# Patient Record
Sex: Female | Born: 2000 | Race: White | Hispanic: No | Marital: Single | Smoking: Never smoker
Health system: Southern US, Community
[De-identification: ages and names within clinical notes are randomized; demographics above are authoritative.]

## PROBLEM LIST (undated history)

## (undated) DIAGNOSIS — J302 Other seasonal allergic rhinitis: Secondary | ICD-10-CM

## (undated) DIAGNOSIS — O039 Complete or unspecified spontaneous abortion without complication: Secondary | ICD-10-CM

## (undated) DIAGNOSIS — E282 Polycystic ovarian syndrome: Secondary | ICD-10-CM

## (undated) HISTORY — PX: WISDOM TOOTH EXTRACTION: SHX21

## (undated) HISTORY — PX: ADENOIDECTOMY: SUR15

## (undated) HISTORY — DX: Polycystic ovarian syndrome: E28.2

## (undated) HISTORY — PX: TONSILLECTOMY: SUR1361

---
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
## 2019-06-07 ENCOUNTER — Other Ambulatory Visit: Payer: Self-pay

## 2019-06-07 ENCOUNTER — Encounter (HOSPITAL_COMMUNITY): Payer: Self-pay

## 2019-06-07 ENCOUNTER — Ambulatory Visit (HOSPITAL_COMMUNITY)
Admission: EM | Admit: 2019-06-07 | Discharge: 2019-06-07 | Disposition: A | Discharge status: Home / Self Care | Payer: Medicaid Other | Attending: Family Medicine | Admitting: Family Medicine

## 2019-06-07 DIAGNOSIS — Z833 Family history of diabetes mellitus: Secondary | ICD-10-CM | POA: Diagnosis not present

## 2019-06-07 DIAGNOSIS — Z882 Allergy status to sulfonamides status: Secondary | ICD-10-CM | POA: Insufficient documentation

## 2019-06-07 DIAGNOSIS — R195 Other fecal abnormalities: Secondary | ICD-10-CM | POA: Diagnosis not present

## 2019-06-07 DIAGNOSIS — Z20828 Contact with and (suspected) exposure to other viral communicable diseases: Secondary | ICD-10-CM | POA: Diagnosis not present

## 2019-06-07 DIAGNOSIS — R112 Nausea with vomiting, unspecified: Secondary | ICD-10-CM | POA: Diagnosis present

## 2019-06-07 MED ORDER — ONDANSETRON 4 MG PO TBDP
ORAL_TABLET | ORAL | Status: AC
  Filled 2019-06-07: qty 1

## 2019-06-07 MED ORDER — ONDANSETRON 4 MG PO TBDP
4.0000 mg | ORAL_TABLET | Freq: Once | ORAL | Status: AC
  Administered 2019-06-07: 4 mg via ORAL

## 2019-06-07 MED ORDER — ONDANSETRON 4 MG PO TBDP: 4.0000 mg | ORAL_TABLET | Freq: Three times a day (TID) | ORAL | 0 refills | Status: DC | PRN

## 2019-06-07 MED ORDER — OMEPRAZOLE 40 MG PO CPDR: 40.0000 mg | DELAYED_RELEASE_CAPSULE | Freq: Every day | ORAL | 0 refills | Status: DC

## 2019-06-07 NOTE — ED Provider Notes (Signed)
[name]    CSN: [id number] Arrival date & time: 06/07/19  1306      History   Chief Complaint Chief Complaint  Patient presents with  . Hematemesis    HPI Susan Vazquez is a 18 y.o. female.   Susan Vazquez presents with complaints of 1 episode of emesis today at approximately 12:45 today. She had eaten breakfast of pancakes and eggs and shortly after felt nausea and vomited. States there was some dark red/ rusty colored blood appearing substance in emesis, mixed with her undigested food. Ate pork cops last night, no known intake of red food. No abdominal pain but still with nausea. States that for the past few days her bm's have been more loose, has approximately 2 a day, which is not unusual for her. No fevers. Has also had some nasal drainage, headache and occasional body aches. Her throat has been scratchy. No known ill contacts. Works at [employer]. Denies any previous similar. Without contributing medical history.     ROS per HPI, negative if not otherwise mentioned.      Past Medical History:  Diagnosis Date  . Seasonal allergies     There are no active problems to display for this patient.   Past Surgical History:  Procedure Laterality Date  . ADENOIDECTOMY    . TONSILLECTOMY    . TONSILLECTOMY      OB History   No obstetric history on file.      Home Medications    Prior to Admission medications   Medication Sig Start Date End Date Taking? Authorizing Provider  albuterol (PROVENTIL HFA;VENTOLIN HFA) 108 (90 BASE) MCG/ACT inhaler Inhale 2 puffs into the lungs every 4 (four) hours as needed. For wheezing/shortness of breath. 09/04/11   Ann Maki, MD  Ascorbic Acid (VITAMIN C PO) Take 1 tablet by mouth daily.    [provider]  cetirizine (ZYRTEC) 10 MG tablet Take 10 mg by mouth daily.      [provider]  Cholecalciferol (VITAMIN D PO) Take 1 tablet by mouth daily.    [provider]  fluticasone  (FLONASE) 50 MCG/ACT nasal spray Place 2 sprays into the nose daily.      [provider]  ibuprofen (ADVIL,MOTRIN) 200 MG tablet Take 400 mg by mouth every 6 (six) hours as needed for moderate pain.     [provider]  ipratropium (ATROVENT) 0.06 % nasal spray Place 2 sprays into both nostrils 4 (four) times daily. 09/12/13   Gregor Hams, MD  ondansetron (ZOFRAN-ODT) 4 MG disintegrating tablet Take 1 tablet (4 mg total) by mouth every 8 (eight) hours as needed for nausea or vomiting. 06/07/19   Zigmund Gottron, NP    Family History Family History  Problem Relation Age of Onset  . Diabetes Mother   . Healthy Father     Social History Social History   Tobacco Use  . Smoking status: Never Smoker  . Smokeless tobacco: Never Used  Substance Use Topics  . Alcohol use: No  . Drug use: Not on file     Allergies   Sulfa antibiotics   Review of Systems Review of Systems   Physical Exam Triage Vital Signs ED Triage Vitals  Enc Vitals Group     BP 06/07/19 1352 119/77     Pulse Rate 06/07/19 1352 93     Resp 06/07/19 1352 16     Temp 06/07/19 1352 97.9 F (36.6 C)  Temp Source 06/07/19 1352 Oral     SpO2 06/07/19 1352 98 %     Weight --      Height --      Head Circumference --      Peak Flow --      Pain Score 06/07/19 1350 0     Pain Loc --      Pain Edu? --      Excl. in [place]? --    No data found.  Updated Vital Signs BP 119/77 (BP Location: Left Arm)   Pulse 93   Temp 97.9 F (36.6 C) (Oral)   Resp 16   SpO2 98%    Physical Exam Constitutional:      General: She is not in acute distress.    Appearance: She is well-developed.  Cardiovascular:     Rate and Rhythm: Normal rate.  Pulmonary:     Effort: Pulmonary effort is normal.  Abdominal:     Tenderness: There is no abdominal tenderness.  Skin:    General: Skin is warm and dry.  Neurological:     Mental Status: She is alert and oriented to person, place, and time.      UC  Treatments / Results  Labs (all labs ordered are listed, but only abnormal results are displayed) Labs Reviewed  NOVEL CORONAVIRUS, NAA (HOSP ORDER, SEND-OUT TO REF LAB; TAT 18-24 HRS)    EKG   Radiology No results found.  Procedures Procedures (including critical care time)  Medications Ordered in UC Medications  ondansetron (ZOFRAN-ODT) disintegrating tablet 4 mg (has no administration in time range)    Initial Impression / Assessment and Plan / UC Course  I have reviewed the triage vital signs and the nursing notes.  Pertinent labs & imaging results that were available during my care of the patient were reviewed by me and considered in my medical decision making (see chart for details).     1 episode of emesis this morning. Still with mild nausea. No pain. No fevers. No weakness. BP stable, HR is stable. Viral gastroenteritis vs covid vs gastritis considered and discussed. Zofran, omeprazole provided. covid testing collected and pending. Bland diet, fluids encouraged. Return precautions provided. Patient verbalized understanding and agreeable to plan.   Final Clinical Impressions(s) / UC Diagnoses   Final diagnoses:  Intractable vomiting with nausea, unspecified vomiting type  Loose stools     Discharge Instructions     Small frequent sips of fluids- Pedialyte, Gatorade, water, broth- to maintain hydration.   Bland diet as tolerated.  Zofran every 8 hours as needed for nausea or vomiting.   Self isolate until covid results are back and negative.  Will notify you by phone of any positive findings. Your negative results will be sent through your MyChart.     Please return or go to the ER for any worsening of symptoms: abdominal pain, fevers, black or blood in your stool, dizziness, weakness, fatigue, or otherwise worsening.    ED Prescriptions    Medication Sig Dispense Auth. Provider   ondansetron (ZOFRAN-ODT) 4 MG disintegrating tablet Take 1 tablet (4 mg total)  by mouth every 8 (eight) hours as needed for nausea or vomiting. 12 tablet Zigmund Gottron, NP     PDMP not reviewed this encounter.   Zigmund Gottron, NP 06/07/19 [phone]

## 2019-06-07 NOTE — ED Triage Notes (Signed)
Patient presents to Urgent Care with complaints of blood in her vomit since this morning. Patient reports the blood was mixed in with the food she had eaten earlier, has not had additional episodes since, pt denies nausea at present.

## 2019-06-07 NOTE — Discharge Instructions (Signed)
Small frequent sips of fluids- Pedialyte, Gatorade, water, broth- to maintain hydration.   Bland diet as tolerated.  Zofran every 8 hours as needed for nausea or vomiting.   Self isolate until covid results are back and negative.  Will notify you by phone of any positive findings. Your negative results will be sent through your MyChart.     Please return or go to the ER for any worsening of symptoms: abdominal pain, fevers, black or blood in your stool, dizziness, weakness, fatigue, or otherwise worsening.

## 2019-06-09 LAB — NOVEL CORONAVIRUS, NAA (HOSP ORDER, SEND-OUT TO REF LAB; TAT 18-24 HRS): SARS-CoV-2, NAA: NOT DETECTED

## 2019-06-17 ENCOUNTER — Other Ambulatory Visit: Payer: Self-pay

## 2019-06-17 ENCOUNTER — Ambulatory Visit (HOSPITAL_COMMUNITY)
Admission: EM | Admit: 2019-06-17 | Discharge: 2019-06-17 | Disposition: A | Discharge status: Home / Self Care | Payer: Medicaid Other | Attending: Internal Medicine | Admitting: Internal Medicine

## 2019-06-17 ENCOUNTER — Encounter (HOSPITAL_COMMUNITY): Payer: Self-pay

## 2019-06-17 DIAGNOSIS — R5383 Other fatigue: Secondary | ICD-10-CM | POA: Insufficient documentation

## 2019-06-17 DIAGNOSIS — R519 Headache, unspecified: Secondary | ICD-10-CM | POA: Insufficient documentation

## 2019-06-17 DIAGNOSIS — R112 Nausea with vomiting, unspecified: Secondary | ICD-10-CM | POA: Insufficient documentation

## 2019-06-17 DIAGNOSIS — Z79899 Other long term (current) drug therapy: Secondary | ICD-10-CM | POA: Insufficient documentation

## 2019-06-17 DIAGNOSIS — M791 Myalgia, unspecified site: Secondary | ICD-10-CM | POA: Diagnosis not present

## 2019-06-17 DIAGNOSIS — Z3202 Encounter for pregnancy test, result negative: Secondary | ICD-10-CM

## 2019-06-17 DIAGNOSIS — Z20828 Contact with and (suspected) exposure to other viral communicable diseases: Secondary | ICD-10-CM | POA: Insufficient documentation

## 2019-06-17 LAB — POCT URINALYSIS DIP (DEVICE)
Bilirubin Urine: NEGATIVE
Glucose, UA: NEGATIVE mg/dL
Hgb urine dipstick: NEGATIVE
Ketones, ur: NEGATIVE mg/dL
Leukocytes,Ua: NEGATIVE
Nitrite: NEGATIVE
Protein, ur: NEGATIVE mg/dL
Specific Gravity, Urine: 1.02 (ref 1.005–1.030)
Urobilinogen, UA: 0.2 mg/dL (ref 0.0–1.0)
pH: 7 (ref 5.0–8.0)

## 2019-06-17 LAB — POCT PREGNANCY, URINE: Preg Test, Ur: NEGATIVE

## 2019-06-17 MED ORDER — KETOROLAC TROMETHAMINE 60 MG/2ML IM SOLN
INTRAMUSCULAR | Status: AC
  Filled 2019-06-17: qty 2

## 2019-06-17 MED ORDER — ONDANSETRON 4 MG PO TBDP
ORAL_TABLET | ORAL | Status: AC
  Filled 2019-06-17: qty 1

## 2019-06-17 MED ORDER — ONDANSETRON 4 MG PO TBDP
4.0000 mg | ORAL_TABLET | Freq: Once | ORAL | Status: AC
  Administered 2019-06-17: 4 mg via ORAL

## 2019-06-17 MED ORDER — ONDANSETRON 4 MG PO TBDP: 4.0000 mg | ORAL_TABLET | Freq: Three times a day (TID) | ORAL | 0 refills | Status: DC | PRN

## 2019-06-17 MED ORDER — KETOROLAC TROMETHAMINE 60 MG/2ML IM SOLN
60.0000 mg | Freq: Once | INTRAMUSCULAR | Status: AC
  Administered 2019-06-17: 60 mg via INTRAMUSCULAR

## 2019-06-17 NOTE — ED Provider Notes (Signed)
[place]    CSN: [id number] Arrival date & time: 06/17/19  1138      History   Chief Complaint Chief Complaint  Patient presents with  . Generalized Body Aches  . Headache  . Nausea  . Fatigue    HPI Susan Vazquez is a 18 y.o. female.   [name] presents with complaints of headache which started last night. Woke today still with headache and felt unwell. Went to work but felt body aches. Now feels more weak. Nausea and then vomited. No current nausea. No fevers. Still with headache. No medications for headache. Has had some congestion, this is not new. No cough, no sore throat. No known ill contacts. LMP 4 weeks ago. She is sexually active. No urinary symptoms. No diarrhea. No abdominal pain. Taking OTC allergy medications. Episode of emesis 11/6 and was seen here, following that visit she felt well again. No known diagnosed migraine history but states she does tend to get headache's fairly regularly. Without contributing medical history.     ROS per HPI, negative if not otherwise mentioned.      Past Medical History:  Diagnosis Date  . Seasonal allergies     There are no active problems to display for this patient.   Past Surgical History:  Procedure Laterality Date  . ADENOIDECTOMY    . TONSILLECTOMY    . TONSILLECTOMY      OB History   No obstetric history on file.      Home Medications    Prior to Admission medications   Medication Sig Start Date End Date Taking? Authorizing Provider  albuterol (PROVENTIL HFA;VENTOLIN HFA) 108 (90 BASE) MCG/ACT inhaler Inhale 2 puffs into the lungs every 4 (four) hours as needed. For wheezing/shortness of breath. 09/04/11   Ann Maki, MD  Ascorbic Acid (VITAMIN C PO) Take 1 tablet by mouth daily.    [provider]  cetirizine (ZYRTEC) 10 MG tablet Take 10 mg by mouth daily.      [provider]  Cholecalciferol (VITAMIN D PO) Take 1 tablet by mouth daily.    [provider]  fluticasone (FLONASE) 50 MCG/ACT nasal spray Place 2 sprays into the nose daily.      [provider]  ibuprofen (ADVIL,MOTRIN) 200 MG tablet Take 400 mg by mouth every 6 (six) hours as needed for moderate pain.     [provider]  ipratropium (ATROVENT) 0.06 % nasal spray Place 2 sprays into both nostrils 4 (four) times daily. 09/12/13   Gregor Hams, MD  loratadine (CLARITIN) 10 MG tablet Take 10 mg by mouth daily. 04/02/19   [provider]  omeprazole (PRILOSEC) 40 MG capsule Take 1 capsule (40 mg total) by mouth daily. 06/07/19   Zigmund Gottron, NP  ondansetron (ZOFRAN-ODT) 4 MG disintegrating tablet Take 1 tablet (4 mg total) by mouth every 8 (eight) hours as needed for nausea or vomiting. 06/17/19   Zigmund Gottron, NP  SPRINTEC 28 0.25-35 MG-MCG tablet Take 1 tablet by mouth daily. 04/03/19   [provider]    Family History Family History  Problem Relation Age of Onset  . Diabetes Mother   . Healthy Father     Social History Social History   Tobacco Use  . Smoking status: Never Smoker  . Smokeless tobacco: Never Used  Substance Use Topics  . Alcohol use: No  . Drug use: Not on file     Allergies  Sulfa antibiotics   Review of Systems Review of Systems   Physical Exam Triage Vital Signs ED Triage Vitals  Enc Vitals Group     BP 06/17/19 1247 122/71     Pulse Rate 06/17/19 1247 79     Resp 06/17/19 1247 16     Temp 06/17/19 1247 98 F (36.7 C)     Temp Source 06/17/19 1247 Oral     SpO2 06/17/19 1247 100 %     Weight --      Height --      Head Circumference --      Peak Flow --      Pain Score 06/17/19 1243 6     Pain Loc --      Pain Edu? --      Excl. in [place]? --    No data found.  Updated Vital Signs BP 122/71 (BP Location: Left Arm)   Pulse 79   Temp 98 F (36.7 C) (Oral)   Resp 16   LMP  (Within Weeks) Comment: 3-4- weeks per pt  SpO2 100%   Visual Acuity Right Eye Distance:   Left  Eye Distance:   Bilateral Distance:    Right Eye Near:   Left Eye Near:    Bilateral Near:     Physical Exam Constitutional:      General: She is not in acute distress.    Appearance: She is well-developed.  Cardiovascular:     Rate and Rhythm: Normal rate.     Heart sounds: Normal heart sounds.  Pulmonary:     Effort: Pulmonary effort is normal.  Abdominal:     Palpations: Abdomen is soft.     Tenderness: There is no abdominal tenderness.  Skin:    General: Skin is warm and dry.  Neurological:     Mental Status: She is alert and oriented to person, place, and time.      UC Treatments / Results  Labs (all labs ordered are listed, but only abnormal results are displayed) Labs Reviewed  NOVEL CORONAVIRUS, NAA (HOSP ORDER, SEND-OUT TO REF LAB; TAT 18-24 HRS)  POC URINE PREG, ED    EKG   Radiology No results found.  Procedures Procedures (including critical care time)  Medications Ordered in UC Medications  ketorolac (TORADOL) injection 60 mg (60 mg Intramuscular Given 06/17/19 1310)  ondansetron (ZOFRAN-ODT) disintegrating tablet 4 mg (4 mg Oral Given 06/17/19 1310)  ondansetron (ZOFRAN-ODT) 4 MG disintegrating tablet (has no administration in time range)  ketorolac (TORADOL) 60 MG/2ML injection (has no administration in time range)    Initial Impression / Assessment and Plan / UC Course  I have reviewed the triage vital signs and the nursing notes.  Pertinent labs & imaging results that were available during my care of the patient were reviewed by me and considered in my medical decision making (see chart for details).     Non toxic. Benign physical exam. No acute abdominal pain. UA and pregnancy without acute findings. Mild headache still. Question migraine headache as source of nausea/vomiting? Discussed with patient. zofran and toradol provided here today. covid testing collected and pending. Encouraged follow up with a PCP for recheck and management.  Return precautions provided. Patient verbalized understanding and agreeable to plan.   Final Clinical Impressions(s) / UC Diagnoses   Final diagnoses:  Acute nonintractable headache, unspecified headache type  Non-intractable vomiting with nausea, unspecified vomiting type     Discharge Instructions     May take Benadryl as  well to promote sleep and help with headache.  Go home, rest in quiet dark room. Limit screen time.  Drink plenty of water to ensure adequate hydration.   May take tylenol as needed, don't take any additional ibuprofen for another 8 hours.  If symptoms worsen or do not improve in the next week to return to be seen or to follow up with your PCP.      ED Prescriptions    Medication Sig Dispense Auth. Provider   ondansetron (ZOFRAN-ODT) 4 MG disintegrating tablet Take 1 tablet (4 mg total) by mouth every 8 (eight) hours as needed for nausea or vomiting. 12 tablet Zigmund Gottron, NP     PDMP not reviewed this encounter.   Zigmund Gottron, NP 06/17/19 1333

## 2019-06-17 NOTE — ED Triage Notes (Signed)
Pt presents to the UC with headache, generalized body aches, fatigue and nausea x 1 day.

## 2019-06-17 NOTE — Discharge Instructions (Signed)
May take Benadryl as well to promote sleep and help with headache.  Go home, rest in quiet dark room. Limit screen time.  Drink plenty of water to ensure adequate hydration.   May take tylenol as needed, don't take any additional ibuprofen for another 8 hours.  If symptoms worsen or do not improve in the next week to return to be seen or to follow up with your PCP.

## 2019-06-19 LAB — NOVEL CORONAVIRUS, NAA (HOSP ORDER, SEND-OUT TO REF LAB; TAT 18-24 HRS): SARS-CoV-2, NAA: NOT DETECTED

## 2019-07-03 ENCOUNTER — Ambulatory Visit (HOSPITAL_COMMUNITY)
Admission: EM | Admit: 2019-07-03 | Discharge: 2019-07-03 | Disposition: A | Discharge status: Home / Self Care | Payer: Medicaid Other | Attending: Urgent Care | Admitting: Urgent Care

## 2019-07-03 ENCOUNTER — Other Ambulatory Visit: Payer: Self-pay

## 2019-07-03 ENCOUNTER — Encounter (HOSPITAL_COMMUNITY): Payer: Self-pay

## 2019-07-03 ENCOUNTER — Ambulatory Visit (INDEPENDENT_AMBULATORY_CARE_PROVIDER_SITE_OTHER): Payer: Medicaid Other

## 2019-07-03 DIAGNOSIS — M7989 Other specified soft tissue disorders: Secondary | ICD-10-CM | POA: Diagnosis not present

## 2019-07-03 DIAGNOSIS — M79644 Pain in right finger(s): Secondary | ICD-10-CM

## 2019-07-03 DIAGNOSIS — S6701XA Crushing injury of right thumb, initial encounter: Secondary | ICD-10-CM | POA: Diagnosis not present

## 2019-07-03 DIAGNOSIS — W230XXA Caught, crushed, jammed, or pinched between moving objects, initial encounter: Secondary | ICD-10-CM

## 2019-07-03 DIAGNOSIS — S6010XA Contusion of unspecified finger with damage to nail, initial encounter: Secondary | ICD-10-CM

## 2019-07-03 MED ORDER — NAPROXEN 500 MG PO TABS: 500.0000 mg | ORAL_TABLET | Freq: Two times a day (BID) | ORAL | 0 refills | Status: DC

## 2019-07-03 NOTE — ED Triage Notes (Signed)
Pt states she shut her thumb in the car door last night. ( right thumb )

## 2019-07-03 NOTE — ED Provider Notes (Signed)
[place]   MRN: [id number] DOB: 13-Oct-2000  Subjective:   Susan Vazquez is a 18 y.o. female presenting for suffering a right thumb injury last night from accidentally closing the car door on her thumb.  Patient has since had worsening constant throbbing sharp pain of her right thumb with bruising and discoloration of the fingernail.  She has tried Tylenol and Advil with minimal relief.  No current facility-administered medications for this encounter.   Current Outpatient Medications:  .  albuterol (PROVENTIL HFA;VENTOLIN HFA) 108 (90 BASE) MCG/ACT inhaler, Inhale 2 puffs into the lungs every 4 (four) hours as needed. For wheezing/shortness of breath., Disp: , Rfl:  .  Ascorbic Acid (VITAMIN C PO), Take 1 tablet by mouth daily., Disp: , Rfl:  .  cetirizine (ZYRTEC) 10 MG tablet, Take 10 mg by mouth daily.  , Disp: , Rfl:  .  Cholecalciferol (VITAMIN D PO), Take 1 tablet by mouth daily., Disp: , Rfl:  .  fluticasone (FLONASE) 50 MCG/ACT nasal spray, Place 2 sprays into the nose daily.  , Disp: , Rfl:  .  ibuprofen (ADVIL,MOTRIN) 200 MG tablet, Take 400 mg by mouth every 6 (six) hours as needed for moderate pain. , Disp: , Rfl:  .  ipratropium (ATROVENT) 0.06 % nasal spray, Place 2 sprays into both nostrils 4 (four) times daily., Disp: 15 mL, Rfl: 1 .  loratadine (CLARITIN) 10 MG tablet, Take 10 mg by mouth daily., Disp: , Rfl:  .  omeprazole (PRILOSEC) 40 MG capsule, Take 1 capsule (40 mg total) by mouth daily., Disp: 30 capsule, Rfl: 0 .  ondansetron (ZOFRAN-ODT) 4 MG disintegrating tablet, Take 1 tablet (4 mg total) by mouth every 8 (eight) hours as needed for nausea or vomiting., Disp: 12 tablet, Rfl: 0 .  SPRINTEC 28 0.25-35 MG-MCG tablet, Take 1 tablet by mouth daily., Disp: , Rfl:    Allergies  Allergen Reactions  . Sulfa Antibiotics Rash    Past Medical History:  Diagnosis Date  . Seasonal allergies      Past Surgical History:  Procedure Laterality Date  .  ADENOIDECTOMY    . TONSILLECTOMY    . TONSILLECTOMY      Family History  Problem Relation Age of Onset  . Diabetes Mother   . Healthy Father     Social History   Tobacco Use  . Smoking status: Never Smoker  . Smokeless tobacco: Never Used  Substance Use Topics  . Alcohol use: No  . Drug use: Not on file    ROS   Objective:   Vitals: BP 137/64 (BP Location: Right Arm)   Pulse 90   Temp 98.4 F (36.9 C) (Oral)   Resp 18   Wt 215 lb (97.5 kg)   LMP 07/03/2019   SpO2 100%   Physical Exam Constitutional:      General: She is not in acute distress.    Appearance: Normal appearance. She is well-developed. She is obese. She is not ill-appearing, toxic-appearing or diaphoretic.  HENT:     Head: Normocephalic and atraumatic.     Nose: Nose normal.     Mouth/Throat:     Mouth: Mucous membranes are moist.     Pharynx: Oropharynx is clear.  Eyes:     General: No scleral icterus.    Extraocular Movements: Extraocular movements intact.     Pupils: Pupils are equal, round, and reactive to light.  Cardiovascular:     Rate and Rhythm: Normal rate.  Pulmonary:  Effort: Pulmonary effort is normal.  Musculoskeletal:     Right hand: She exhibits decreased range of motion, tenderness, bony tenderness and swelling. She exhibits normal capillary refill, no deformity and no laceration. Normal sensation noted. Normal strength noted.       Hands:  Skin:    General: Skin is warm and dry.  Neurological:     General: No focal deficit present.     Mental Status: She is alert and oriented to person, place, and time.  Psychiatric:        Mood and Affect: Mood normal.        Behavior: Behavior normal.        Thought Content: Thought content normal.        Judgment: Judgment normal.    Dg Finger Thumb Right  Result Date: 07/03/2019 CLINICAL DATA:  Crush injury of right thumb. Pain, bruising, and swelling. EXAM: RIGHT THUMB 2+V COMPARISON:  Radiographs dated 08/24/2011 FINDINGS:  There is no evidence of fracture or dislocation. There is no evidence of arthropathy or other focal bone abnormality. Soft tissues are unremarkable. IMPRESSION: Negative. Electronically Signed   By: Lorriane Shire M.D.   On: 07/03/2019 12:38   A distal thumb splint was placed over distal phalange of right hand.  Assessment and Plan :   1. Crushing injury of right thumb, initial encounter   2. Pain of right thumb   3. Swelling of right thumb   4. Subungual hematoma of finger, initial encounter     We will manage conservatively with NSAID, finger splint and rest. Counseled patient on potential for adverse effects with medications prescribed/recommended today, ER and return-to-clinic precautions discussed, patient verbalized understanding.    Jaynee Eagles, PA-C 07/03/19 1250

## 2019-07-10 ENCOUNTER — Other Ambulatory Visit: Payer: Self-pay

## 2019-07-10 ENCOUNTER — Encounter (HOSPITAL_COMMUNITY): Payer: Self-pay | Admitting: Emergency Medicine

## 2019-07-10 ENCOUNTER — Ambulatory Visit (HOSPITAL_COMMUNITY)
Admission: EM | Admit: 2019-07-10 | Discharge: 2019-07-10 | Disposition: A | Discharge status: Home / Self Care | Payer: Medicaid Other | Attending: Family Medicine | Admitting: Family Medicine

## 2019-07-10 DIAGNOSIS — Z791 Long term (current) use of non-steroidal anti-inflammatories (NSAID): Secondary | ICD-10-CM | POA: Insufficient documentation

## 2019-07-10 DIAGNOSIS — R0602 Shortness of breath: Secondary | ICD-10-CM | POA: Insufficient documentation

## 2019-07-10 DIAGNOSIS — R519 Headache, unspecified: Secondary | ICD-10-CM

## 2019-07-10 DIAGNOSIS — R0982 Postnasal drip: Secondary | ICD-10-CM | POA: Diagnosis not present

## 2019-07-10 DIAGNOSIS — R439 Unspecified disturbances of smell and taste: Secondary | ICD-10-CM | POA: Insufficient documentation

## 2019-07-10 DIAGNOSIS — Z20828 Contact with and (suspected) exposure to other viral communicable diseases: Secondary | ICD-10-CM | POA: Diagnosis not present

## 2019-07-10 DIAGNOSIS — J069 Acute upper respiratory infection, unspecified: Secondary | ICD-10-CM | POA: Diagnosis not present

## 2019-07-10 DIAGNOSIS — R5383 Other fatigue: Secondary | ICD-10-CM | POA: Insufficient documentation

## 2019-07-10 DIAGNOSIS — R438 Other disturbances of smell and taste: Secondary | ICD-10-CM

## 2019-07-10 DIAGNOSIS — Z79899 Other long term (current) drug therapy: Secondary | ICD-10-CM | POA: Insufficient documentation

## 2019-07-10 NOTE — Discharge Instructions (Signed)
Self isolate until covid results are back and negative.  Will notify you by phone of any positive findings. Your negative results will be sent through your MyChart.     Push fluids to ensure adequate hydration and keep secretions thin.  Tylenol and/or ibuprofen as needed for pain or fevers.  Rest.  If symptoms worsen or do not improve in the next week to return to be seen or to follow up with your PCP.

## 2019-07-10 NOTE — ED Triage Notes (Signed)
Pt c/o fatigue onset today but reports 2 days ago she had loss of taste and loss of smell, SOB and headache.   Denies fevers  A&O x4... no acute distress... ambulatory

## 2019-07-10 NOTE — ED Provider Notes (Signed)
[place]    CSN: [id number] Arrival date & time: 07/10/19  1618      History   Chief Complaint Chief Complaint  Patient presents with  . Fatigue    HPI Susan Vazquez is a 18 y.o. female.   Susan Vazquez presents with complaints of congestion which started two days ago. Has been taking allergy medication and aleve-d for congestion. Loss of taste and smell which she noted yesterday. Nausea this morning, has improved today. Headache. Decreased appetite. Fatigue. Shortness of breath. Low energy. No cough. Post nasal drip. No sore throat. Body aches. No fever. No vomiting or diarrhea. Over the counter medications have helped with her congestion. Has been out of work for a few weeks. Her boyfriend had a coworker who had tested positive for covid-19 but he has felt well. History  Of seasonal allergies.    ROS per HPI, negative if not otherwise mentioned.      Past Medical History:  Diagnosis Date  . Seasonal allergies     There are no active problems to display for this patient.   Past Surgical History:  Procedure Laterality Date  . ADENOIDECTOMY    . TONSILLECTOMY    . TONSILLECTOMY      OB History   No obstetric history on file.      Home Medications    Prior to Admission medications   Medication Sig Start Date End Date Taking? Authorizing Provider  albuterol (PROVENTIL HFA;VENTOLIN HFA) 108 (90 BASE) MCG/ACT inhaler Inhale 2 puffs into the lungs every 4 (four) hours as needed. For wheezing/shortness of breath. 09/04/11   Ann Maki, MD  Ascorbic Acid (VITAMIN C PO) Take 1 tablet by mouth daily.    [provider]  cetirizine (ZYRTEC) 10 MG tablet Take 10 mg by mouth daily.      [provider]  Cholecalciferol (VITAMIN D PO) Take 1 tablet by mouth daily.    [provider]  fluticasone (FLONASE) 50 MCG/ACT nasal spray Place 2 sprays into the nose daily.      [provider]  ibuprofen (ADVIL,MOTRIN) 200 MG  tablet Take 400 mg by mouth every 6 (six) hours as needed for moderate pain.     [provider]  ipratropium (ATROVENT) 0.06 % nasal spray Place 2 sprays into both nostrils 4 (four) times daily. 09/12/13   Gregor Hams, MD  loratadine (CLARITIN) 10 MG tablet Take 10 mg by mouth daily. 04/02/19   [provider]  naproxen (NAPROSYN) 500 MG tablet Take 1 tablet (500 mg total) by mouth 2 (two) times daily. 07/03/19   Jaynee Eagles, PA-C  omeprazole (PRILOSEC) 40 MG capsule Take 1 capsule (40 mg total) by mouth daily. 06/07/19   Zigmund Gottron, NP  ondansetron (ZOFRAN-ODT) 4 MG disintegrating tablet Take 1 tablet (4 mg total) by mouth every 8 (eight) hours as needed for nausea or vomiting. 06/17/19   Zigmund Gottron, NP  SPRINTEC 28 0.25-35 MG-MCG tablet Take 1 tablet by mouth daily. 04/03/19   [provider]    Family History Family History  Problem Relation Age of Onset  . Diabetes Mother   . Healthy Father     Social History Social History   Tobacco Use  . Smoking status: Never Smoker  . Smokeless tobacco: Never Used  Substance Use Topics  . Alcohol use: No  . Drug use: Not on file     Allergies   Sulfa antibiotics   Review  of Systems Review of Systems   Physical Exam Triage Vital Signs ED Triage Vitals  Enc Vitals Group     BP 07/10/19 1701 117/78     Pulse --      Resp 07/10/19 1701 18     Temp 07/10/19 1701 99.1 F (37.3 C)     Temp Source 07/10/19 1701 Oral     SpO2 07/10/19 1701 99 %     Weight --      Height --      Head Circumference --      Peak Flow --      Pain Score 07/10/19 1703 0     Pain Loc --      Pain Edu? --      Excl. in [place]? --    No data found.  Updated Vital Signs BP 117/78 (BP Location: Left Arm)   Temp 99.1 F (37.3 C) (Oral)   Resp 18   LMP 07/03/2019   SpO2 99%    Physical Exam Constitutional:      General: She is not in acute distress.    Appearance: She is well-developed.  HENT:     Nose: Nose  normal.     Mouth/Throat:     Mouth: Mucous membranes are moist.     Pharynx: No oropharyngeal exudate or posterior oropharyngeal erythema.  Eyes:     Pupils: Pupils are equal, round, and reactive to light.  Cardiovascular:     Rate and Rhythm: Normal rate.  Pulmonary:     Effort: Pulmonary effort is normal.  Skin:    General: Skin is warm and dry.  Neurological:     Mental Status: She is alert and oriented to person, place, and time.      UC Treatments / Results  Labs (all labs ordered are listed, but only abnormal results are displayed) Labs Reviewed  NOVEL CORONAVIRUS, NAA (HOSP ORDER, SEND-OUT TO REF LAB; TAT 18-24 HRS)    EKG   Radiology No results found.  Procedures Procedures (including critical care time)  Medications Ordered in UC Medications - No data to display  Initial Impression / Assessment and Plan / UC Course  I have reviewed the triage vital signs and the nursing notes.  Pertinent labs & imaging results that were available during my care of the patient were reviewed by me and considered in my medical decision making (see chart for details).     Non toxic. Benign physical exam. Afebrile. No increased work of breathing, no hypoxia. covid testing collected and pending. Will notify of any positive findings and if any changes to treatment are needed.  Isolation recommended. Return precautions provided. Patient verbalized understanding and agreeable to plan.   Final Clinical Impressions(s) / UC Diagnoses   Final diagnoses:  Upper respiratory tract infection, unspecified type     Discharge Instructions     Self isolate until covid results are back and negative.  Will notify you by phone of any positive findings. Your negative results will be sent through your MyChart.     Push fluids to ensure adequate hydration and keep secretions thin.  Tylenol and/or ibuprofen as needed for pain or fevers.  Rest.  If symptoms worsen or do not improve in the  next week to return to be seen or to follow up with your PCP.      ED Prescriptions    None     PDMP not reviewed this encounter.   Zigmund Gottron, NP 07/10/19 1753

## 2019-07-12 LAB — NOVEL CORONAVIRUS, NAA (HOSP ORDER, SEND-OUT TO REF LAB; TAT 18-24 HRS): SARS-CoV-2, NAA: NOT DETECTED

## 2019-10-30 ENCOUNTER — Ambulatory Visit (HOSPITAL_COMMUNITY)
Admission: EM | Admit: 2019-10-30 | Discharge: 2019-10-30 | Discharge status: Against Medical Advice | Payer: Medicaid Other

## 2019-10-30 ENCOUNTER — Other Ambulatory Visit: Payer: Self-pay

## 2019-10-30 NOTE — ED Notes (Signed)
Pt did not answered when I called her in the waiting room.

## 2019-11-04 ENCOUNTER — Other Ambulatory Visit: Payer: Self-pay | Admitting: Pediatrics

## 2019-11-04 DIAGNOSIS — R1011 Right upper quadrant pain: Secondary | ICD-10-CM

## 2019-11-06 ENCOUNTER — Ambulatory Visit (HOSPITAL_COMMUNITY)
Admission: RE | Admit: 2019-11-06 | Discharge: 2019-11-06 | Disposition: A | Discharge status: Home / Self Care | Payer: Medicaid Other | Source: Ambulatory Visit | Attending: Pediatrics | Admitting: Pediatrics

## 2019-11-06 ENCOUNTER — Ambulatory Visit
Admission: RE | Admit: 2019-11-06 | Discharge: 2019-11-06 | Disposition: A | Discharge status: Home / Self Care | Payer: Medicaid Other | Source: Ambulatory Visit | Attending: Pediatrics | Admitting: Pediatrics

## 2019-11-06 ENCOUNTER — Other Ambulatory Visit: Payer: Self-pay

## 2019-11-06 ENCOUNTER — Other Ambulatory Visit: Payer: Self-pay | Admitting: Pediatrics

## 2019-11-06 DIAGNOSIS — R1011 Right upper quadrant pain: Secondary | ICD-10-CM

## 2019-11-06 DIAGNOSIS — R1084 Generalized abdominal pain: Secondary | ICD-10-CM

## 2020-01-20 ENCOUNTER — Other Ambulatory Visit: Payer: Self-pay

## 2020-01-20 ENCOUNTER — Ambulatory Visit (HOSPITAL_COMMUNITY)
Admission: EM | Admit: 2020-01-20 | Discharge: 2020-01-20 | Disposition: A | Discharge status: Home / Self Care | Payer: Medicaid Other | Attending: Family Medicine | Admitting: Family Medicine

## 2020-01-20 ENCOUNTER — Encounter (HOSPITAL_COMMUNITY): Payer: Self-pay

## 2020-01-20 DIAGNOSIS — R519 Headache, unspecified: Secondary | ICD-10-CM

## 2020-01-20 DIAGNOSIS — R509 Fever, unspecified: Secondary | ICD-10-CM | POA: Diagnosis not present

## 2020-01-20 DIAGNOSIS — J029 Acute pharyngitis, unspecified: Secondary | ICD-10-CM | POA: Diagnosis not present

## 2020-01-20 DIAGNOSIS — R52 Pain, unspecified: Secondary | ICD-10-CM

## 2020-01-20 DIAGNOSIS — J020 Streptococcal pharyngitis: Secondary | ICD-10-CM

## 2020-01-20 DIAGNOSIS — R6883 Chills (without fever): Secondary | ICD-10-CM

## 2020-01-20 DIAGNOSIS — R000 Tachycardia, unspecified: Secondary | ICD-10-CM

## 2020-01-20 LAB — POCT RAPID STREP A: Streptococcus, Group A Screen (Direct): POSITIVE — AB

## 2020-01-20 MED ORDER — ACETAMINOPHEN 325 MG PO TABS
ORAL_TABLET | ORAL | Status: AC
  Filled 2020-01-20: qty 2

## 2020-01-20 MED ORDER — ACETAMINOPHEN 325 MG PO TABS
650.0000 mg | ORAL_TABLET | Freq: Once | ORAL | Status: AC
  Administered 2020-01-20: 650 mg via ORAL

## 2020-01-20 MED ORDER — AMOXICILLIN 875 MG PO TABS: 875.0000 mg | ORAL_TABLET | Freq: Two times a day (BID) | ORAL | 0 refills | Status: AC

## 2020-01-20 NOTE — ED Provider Notes (Addendum)
[place]   [id number] 01/20/20 Arrival Time: 1024  OY:DXAJ THROAT  SUBJECTIVE: History from: patient.  Susan Vazquez is a 19 y.o. female who presents with abrupt onset of sore throat, fever, headache, body aches, chills for 3 days. Denies to sick exposure to Covid, strep, flu or mono, or precipitating event. Has tried tylenol with little relief. Symptoms are made worse with swallowing, but tolerating liquids and own secretions without difficulty. Reports previous symptoms in the past.     Denies ear pain, sinus pain, rhinorrhea, nasal congestion, cough, SOB, wheezing, chest pain, nausea, rash, changes in bowel or bladder habits.     ROS: As per HPI.  All other pertinent ROS negative.     Past Medical History:  Diagnosis Date   Seasonal allergies    Past Surgical History:  Procedure Laterality Date   ADENOIDECTOMY     TONSILLECTOMY     TONSILLECTOMY     Allergies  Allergen Reactions   Sulfa Antibiotics Rash   No current facility-administered medications on file prior to encounter.   Current Outpatient Medications on File Prior to Encounter  Medication Sig Dispense Refill   albuterol (PROVENTIL HFA;VENTOLIN HFA) 108 (90 BASE) MCG/ACT inhaler Inhale 2 puffs into the lungs every 4 (four) hours as needed. For wheezing/shortness of breath.     Ascorbic Acid (VITAMIN C PO) Take 1 tablet by mouth daily.     cetirizine (ZYRTEC) 10 MG tablet Take 10 mg by mouth daily.       Cholecalciferol (VITAMIN D PO) Take 1 tablet by mouth daily.     fluticasone (FLONASE) 50 MCG/ACT nasal spray Place 2 sprays into the nose daily.       ibuprofen (ADVIL,MOTRIN) 200 MG tablet Take 400 mg by mouth every 6 (six) hours as needed for moderate pain.      ipratropium (ATROVENT) 0.06 % nasal spray Place 2 sprays into both nostrils 4 (four) times daily. 15 mL 1   loratadine (CLARITIN) 10 MG tablet Take 10 mg by mouth daily.     naproxen (NAPROSYN) 500 MG tablet Take 1 tablet  (500 mg total) by mouth 2 (two) times daily. 30 tablet 0   omeprazole (PRILOSEC) 40 MG capsule Take 1 capsule (40 mg total) by mouth daily. 30 capsule 0   ondansetron (ZOFRAN-ODT) 4 MG disintegrating tablet Take 1 tablet (4 mg total) by mouth every 8 (eight) hours as needed for nausea or vomiting. 12 tablet 0   SPRINTEC 28 0.25-35 MG-MCG tablet Take 1 tablet by mouth daily.     Social History   Socioeconomic History   Marital status: Single    Spouse name: Not on file   Number of children: Not on file   Years of education: Not on file   Highest education level: Not on file  Occupational History   Not on file  Tobacco Use   Smoking status: Never Smoker   Smokeless tobacco: Never Used  Vaping Use   Vaping Use: Never used  Substance and Sexual Activity   Alcohol use: No   Drug use: Not on file   Sexual activity: Not on file  Other Topics Concern   Not on file  Social History Narrative   Not on file   Social Determinants of Health   Financial Resource Strain:    Difficulty of Paying Living Expenses:   Food Insecurity:    Worried About [place] in the Last Year:    Arboriculturist in  the Last Year:   Transportation Needs:    Film/video editor (Medical):    Lack of Transportation (Non-Medical):   Physical Activity:    Days of Exercise per Week:    Minutes of Exercise per Session:   Stress:    Feeling of Stress :   Social Connections:    Frequency of Communication with Friends and Family:    Frequency of Social Gatherings with Friends and Family:    Attends Religious Services:    Active Member of Clubs or Organizations:    Attends Music therapist:    Marital Status:   Intimate Partner Violence:    Fear of Current or Ex-Partner:    Emotionally Abused:    Physically Abused:    Sexually Abused:    Family History  Problem Relation Age of Onset   Diabetes Mother    Healthy Father      OBJECTIVE:  Vitals:   01/20/20 1155  BP: 114/79  Pulse: (!) 148  Resp: 16  Temp: (!) 102.3 F (39.1 C)  TempSrc: Oral  SpO2: 99%     General appearance: alert; appears fatigued, but nontoxic, speaking in full sentences and managing own secretions HEENT: NCAT; Ears: EACs clear, TMs pearly gray with visible cone of light, without erythema; Eyes: PERRL, EOMI grossly; Nose: no obvious rhinorrhea; Throat: tonsils absent, oropharynx erythematous without white tonsillar exudates, uvula midline Neck: supple with LAD Lungs: CTA bilaterally without adventitious breath sounds; cough absent Heart: regular rate and rhythm.  Radial pulses 2+ symmetrical bilaterally Skin: warm and dry Psychological: alert and cooperative; normal mood and affect  LABS: Results for orders placed or performed during the hospital encounter of 01/20/20 (from the past 24 hour(s))  POCT rapid strep A [facility])     Status: Abnormal   Collection Time: 01/20/20 12:14 PM  Result Value Ref Range   Streptococcus, Group A Screen (Direct) POSITIVE (A) NEGATIVE     ASSESSMENT & PLAN:  1. Strep throat   2. Fever, unspecified fever cause   3. Tachycardia   4. Nonintractable headache, unspecified chronicity pattern, unspecified headache type   5. Chills   6. Body aches     Meds ordered this encounter  Medications   acetaminophen (TYLENOL) tablet 650 mg   amoxicillin (AMOXIL) 875 MG tablet    Sig: Take 1 tablet (875 mg total) by mouth 2 (two) times daily for 10 days.    Dispense:  20 tablet    Refill:  0    Order Specific Question:   Supervising Provider    Answer:   Chase Picket [id number]    Strep Pharyngitis  Strep was positive.  Push fluids and get rest Prescribed amoxicillin twice daily for 10 days.   Take as directed and to completion.  Drink warm or cool liquids, use throat lozenges, or popsicles to help alleviate symptoms Take OTC ibuprofen or tylenol as needed for pain Follow up  with PCP if symptoms persist Return or go to ER if you have any new or worsening symptoms such as fever, chills, nausea, vomiting, worsening sore throat, cough, abdominal pain, chest pain, changes in bowel or bladder habits, etc...  Follow up with PCP if symptoms persists Return or go to ER if patient has any new or worsening symptoms such as fever, chills, nausea, vomiting, worsening sore throat, cough, abdominal pain, chest pain, changes in bowel or bladder habits  Reviewed expectations re: course of current medical issues. Questions answered. Outlined signs and  symptoms indicating need for more acute intervention. Patient verbalized understanding. After Visit Summary given.          Faustino Congress, NP 01/20/20 [place]    Faustino Congress, NP 01/20/20 1232

## 2020-01-20 NOTE — ED Triage Notes (Signed)
Patient reports fever (up to 103), chills, body aches and pains, earache, headache, and swollen glands since Saturday.

## 2020-01-20 NOTE — Discharge Instructions (Signed)
You have strep throat  I have sent in amoxicillin to your pharmacy to take twice a day for 10 days  Follow up if you are not feeling better by Wednesday, follow up with this office

## 2020-01-30 DIAGNOSIS — Z419 Encounter for procedure for purposes other than remedying health state, unspecified: Secondary | ICD-10-CM | POA: Diagnosis not present

## 2020-02-26 DIAGNOSIS — N939 Abnormal uterine and vaginal bleeding, unspecified: Secondary | ICD-10-CM | POA: Diagnosis not present

## 2020-03-01 DIAGNOSIS — Z419 Encounter for procedure for purposes other than remedying health state, unspecified: Secondary | ICD-10-CM | POA: Diagnosis not present

## 2020-03-04 ENCOUNTER — Other Ambulatory Visit: Payer: Self-pay

## 2020-03-04 ENCOUNTER — Ambulatory Visit
Admission: EM | Admit: 2020-03-04 | Discharge: 2020-03-04 | Disposition: A | Discharge status: Home / Self Care | Payer: Medicaid Other | Attending: Emergency Medicine | Admitting: Emergency Medicine

## 2020-03-04 DIAGNOSIS — J069 Acute upper respiratory infection, unspecified: Secondary | ICD-10-CM | POA: Diagnosis not present

## 2020-03-04 NOTE — ED Triage Notes (Signed)
Pt c/o fatigue, nasal congestion with post nasal drip, and sore throat since Monday evening. States needs work note for yesterday and today.

## 2020-03-04 NOTE — ED Provider Notes (Signed)
[name]    CSN: [id number] Arrival date & time: 03/04/20  0849      History   Chief Complaint Chief Complaint  Patient presents with  . Nasal Congestion    HPI Susan Vazquez is a 19 y.o. female with history of seasonal allergies presenting for URI symptoms since Monday.  Patient provides history: Endorsing fatigue, nasal congestion, postnasal drip, scratchy throat.  Very mild cough without hemoptysis or increased sputum production.  Denies difficulty breathing, chest pain, known sick contacts.  Requesting covid testing to return to work.    Past Medical History:  Diagnosis Date  . Seasonal allergies     There are no problems to display for this patient.   Past Surgical History:  Procedure Laterality Date  . ADENOIDECTOMY    . TONSILLECTOMY    . TONSILLECTOMY      OB History   No obstetric history on file.      Home Medications    Prior to Admission medications   Medication Sig Start Date End Date Taking? Authorizing Provider  albuterol (PROVENTIL HFA;VENTOLIN HFA) 108 (90 BASE) MCG/ACT inhaler Inhale 2 puffs into the lungs every 4 (four) hours as needed. For wheezing/shortness of breath. 09/04/11   Ann Maki, MD  Ascorbic Acid (VITAMIN C PO) Take 1 tablet by mouth daily.    [provider]  Cholecalciferol (VITAMIN D PO) Take 1 tablet by mouth daily.    [provider]  fluticasone (FLONASE) 50 MCG/ACT nasal spray Place 2 sprays into the nose daily.      [provider]  ibuprofen (ADVIL,MOTRIN) 200 MG tablet Take 400 mg by mouth every 6 (six) hours as needed for moderate pain.     [provider]  ipratropium (ATROVENT) 0.06 % nasal spray Place 2 sprays into both nostrils 4 (four) times daily. 09/12/13   Gregor Hams, MD  loratadine (CLARITIN) 10 MG tablet Take 10 mg by mouth daily. 04/02/19   [provider]  naproxen (NAPROSYN) 500 MG tablet Take 1 tablet (500 mg total) by mouth 2 (two) times  daily. 07/03/19   Jaynee Eagles, PA-C  cetirizine (ZYRTEC) 10 MG tablet Take 10 mg by mouth daily.    03/04/20  [provider]    Family History Family History  Problem Relation Age of Onset  . Diabetes Mother   . Healthy Father     Social History Social History   Tobacco Use  . Smoking status: Never Smoker  . Smokeless tobacco: Never Used  Vaping Use  . Vaping Use: Never used  Substance Use Topics  . Alcohol use: No  . Drug use: Not on file     Allergies   Sulfa antibiotics   Review of Systems As per HPI   Physical Exam Triage Vital Signs ED Triage Vitals  Enc Vitals Group     BP      Pulse      Resp      Temp      Temp src      SpO2      Weight      Height      Head Circumference      Peak Flow      Pain Score      Pain Loc      Pain Edu?      Excl. in [place]?    No data found.  Updated Vital Signs BP 117/85 (BP Location: Left Arm)   Pulse  93   Temp 98 F (36.7 C) (Oral)   Resp 18   LMP 03/04/2020   SpO2 98%   Visual Acuity Right Eye Distance:   Left Eye Distance:   Bilateral Distance:    Right Eye Near:   Left Eye Near:    Bilateral Near:     Physical Exam Constitutional:      General: She is not in acute distress.    Appearance: She is obese. She is not ill-appearing or diaphoretic.  HENT:     Head: Normocephalic and atraumatic.     Nose:     Comments: Bilateral turbinate edema with mucosal pallor    Mouth/Throat:     Mouth: Mucous membranes are moist.     Pharynx: Oropharynx is clear. No oropharyngeal exudate or posterior oropharyngeal erythema.  Eyes:     General: No scleral icterus.    Conjunctiva/sclera: Conjunctivae normal.     Pupils: Pupils are equal, round, and reactive to light.  Neck:     Comments: Trachea midline, negative JVD Cardiovascular:     Rate and Rhythm: Normal rate and regular rhythm.     Heart sounds: No murmur heard.  No gallop.   Pulmonary:     Effort: Pulmonary effort is normal. No respiratory  distress.     Breath sounds: No wheezing, rhonchi or rales.  Musculoskeletal:     Cervical back: Neck supple. No tenderness.  Lymphadenopathy:     Cervical: No cervical adenopathy.  Skin:    Capillary Refill: Capillary refill takes less than 2 seconds.     Coloration: Skin is not jaundiced or pale.     Findings: No rash.  Neurological:     General: No focal deficit present.     Mental Status: She is alert and oriented to person, place, and time.      UC Treatments / Results  Labs (all labs ordered are listed, but only abnormal results are displayed) Labs Reviewed  NOVEL CORONAVIRUS, NAA    EKG   Radiology No results found.  Procedures Procedures (including critical care time)  Medications Ordered in UC Medications - No data to display  Initial Impression / Assessment and Plan / UC Course  I have reviewed the triage vital signs and the nursing notes.  Pertinent labs & imaging results that were available during my care of the patient were reviewed by me and considered in my medical decision making (see chart for details).     Patient afebrile, nontoxic, with SpO2 98%.  Covid PCR pending.  Patient to quarantine until results are back.  We will treat supportively as outlined below.  Return precautions discussed, patient verbalized understanding and is agreeable to plan. Final Clinical Impressions(s) / UC Diagnoses   Final diagnoses:  URI, acute     Discharge Instructions     Start flonase, atrovent nasal spray for nasal congestion/drainage. You can use over the counter nasal saline rinse such as neti pot for nasal congestion. Keep hydrated, your urine should be clear to pale yellow in color. Tylenol/motrin for fever and pain. Monitor for any worsening of symptoms, chest pain, shortness of breath, wheezing, swelling of the throat, go to the emergency department for further evaluation needed.   Recommend OTC decongestant as well!    ED Prescriptions    None      PDMP not reviewed this encounter.   [place], [place], [place] 03/04/20 [phone]

## 2020-03-04 NOTE — Discharge Instructions (Addendum)
Start flonase, atrovent nasal spray for nasal congestion/drainage. You can use over the counter nasal saline rinse such as neti pot for nasal congestion. Keep hydrated, your urine should be clear to pale yellow in color. Tylenol/motrin for fever and pain. Monitor for any worsening of symptoms, chest pain, shortness of breath, wheezing, swelling of the throat, go to the emergency department for further evaluation needed.   Recommend OTC decongestant as well!

## 2020-03-06 LAB — SARS-COV-2, NAA 2 DAY TAT

## 2020-03-06 LAB — NOVEL CORONAVIRUS, NAA: SARS-CoV-2, NAA: NOT DETECTED

## 2020-04-01 DIAGNOSIS — Z419 Encounter for procedure for purposes other than remedying health state, unspecified: Secondary | ICD-10-CM | POA: Diagnosis not present

## 2020-05-01 DIAGNOSIS — Z419 Encounter for procedure for purposes other than remedying health state, unspecified: Secondary | ICD-10-CM | POA: Diagnosis not present

## 2020-06-01 DIAGNOSIS — Z419 Encounter for procedure for purposes other than remedying health state, unspecified: Secondary | ICD-10-CM | POA: Diagnosis not present

## 2020-06-11 ENCOUNTER — Ambulatory Visit
Admission: EM | Admit: 2020-06-11 | Discharge: 2020-06-11 | Disposition: A | Discharge status: Home / Self Care | Payer: Medicaid Other | Attending: Emergency Medicine | Admitting: Emergency Medicine

## 2020-06-11 ENCOUNTER — Other Ambulatory Visit: Payer: Self-pay

## 2020-06-11 ENCOUNTER — Encounter: Payer: Self-pay | Admitting: Emergency Medicine

## 2020-06-11 DIAGNOSIS — Z20822 Contact with and (suspected) exposure to covid-19: Secondary | ICD-10-CM | POA: Diagnosis not present

## 2020-06-11 DIAGNOSIS — R6889 Other general symptoms and signs: Secondary | ICD-10-CM

## 2020-06-11 MED ORDER — OSELTAMIVIR PHOSPHATE 45 MG PO CAPS: 45.0000 mg | ORAL_CAPSULE | Freq: Two times a day (BID) | ORAL | 0 refills | Status: DC

## 2020-06-11 NOTE — ED Triage Notes (Signed)
Pt said she woke up this am with a sore throat and then the symptoms progressed over the say with fatigue, fevers, chills, generalized body aches.Pt said she has wanted to sleep all day.

## 2020-06-11 NOTE — Discharge Instructions (Signed)
Your COVID test is pending - it is important to quarantine / isolate at home until your results are back. °If you test positive and would like further evaluation for persistent or worsening symptoms, you may schedule an E-visit or virtual (video) visit throughout the [facility] MyChart app or website. ° °PLEASE NOTE: If you develop severe chest pain or shortness of breath please go to the ER or call [phone] for further evaluation --> DO NOT schedule electronic or virtual visits for this. °Please call our office for further guidance / recommendations as needed. ° °For information about the Covid vaccine, please visit [email or web address] °

## 2020-06-11 NOTE — ED Provider Notes (Signed)
[name]    CSN: [id number] Arrival date & time: 06/11/20  1647      History   Chief Complaint Chief Complaint  Patient presents with  . Generalized Body Aches    HPI Susan Vazquez is a 19 y.o. female  Resenting for flulike symptoms.  Endorsing sore throat, fatigue, myalgias, fever and chills since this morning.  States that she has been sleeping most of the day.  Denies cough, difficulty breathing or chest pain.  Reports good oral intake without emesis or diarrhea.  Past Medical History:  Diagnosis Date  . Seasonal allergies     There are no problems to display for this patient.   Past Surgical History:  Procedure Laterality Date  . ADENOIDECTOMY    . TONSILLECTOMY    . TONSILLECTOMY      OB History   No obstetric history on file.      Home Medications    Prior to Admission medications   Medication Sig Start Date End Date Taking? Authorizing Provider  albuterol (PROVENTIL HFA;VENTOLIN HFA) 108 (90 BASE) MCG/ACT inhaler Inhale 2 puffs into the lungs every 4 (four) hours as needed. For wheezing/shortness of breath. 09/04/11   Ann Maki, MD  Ascorbic Acid (VITAMIN C PO) Take 1 tablet by mouth daily.    [provider]  Cholecalciferol (VITAMIN D PO) Take 1 tablet by mouth daily.    [provider]  fluticasone (FLONASE) 50 MCG/ACT nasal spray Place 2 sprays into the nose daily.      [provider]  ibuprofen (ADVIL,MOTRIN) 200 MG tablet Take 400 mg by mouth every 6 (six) hours as needed for moderate pain.     [provider]  ipratropium (ATROVENT) 0.06 % nasal spray Place 2 sprays into both nostrils 4 (four) times daily. 09/12/13   Gregor Hams, MD  loratadine (CLARITIN) 10 MG tablet Take 10 mg by mouth daily. 04/02/19   [provider]  naproxen (NAPROSYN) 500 MG tablet Take 1 tablet (500 mg total) by mouth 2 (two) times daily. 07/03/19   Jaynee Eagles, PA-C  oseltamivir (TAMIFLU) 45 MG capsule Take 1  capsule (45 mg total) by mouth 2 (two) times daily for 5 days. 06/11/20 06/16/20  Hall-Potvin, [place], PA-C  cetirizine (ZYRTEC) 10 MG tablet Take 10 mg by mouth daily.    03/04/20  [provider]    Family History Family History  Problem Relation Age of Onset  . Diabetes Mother   . Healthy Father     Social History Social History   Tobacco Use  . Smoking status: Never Smoker  . Smokeless tobacco: Never Used  Vaping Use  . Vaping Use: Never used  Substance Use Topics  . Alcohol use: No  . Drug use: Not on file     Allergies   Sulfa antibiotics   Review of Systems Review of Systems  Constitutional: Positive for activity change, appetite change, chills, fatigue and fever.  HENT: Negative for ear pain, sinus pain, sore throat and voice change.   Eyes: Negative for pain, redness and visual disturbance.  Respiratory: Negative for cough and shortness of breath.   Cardiovascular: Negative for chest pain and palpitations.  Gastrointestinal: Negative for abdominal pain, diarrhea and vomiting.  Musculoskeletal: Positive for myalgias. Negative for arthralgias, neck pain and neck stiffness.  Skin: Negative for rash and wound.  Neurological: Negative for syncope and headaches.     Physical Exam Triage Vital Signs ED Triage Vitals [06/11/20 1716]  Enc Vitals Group     BP 113/82     Pulse Rate (!) 147     Resp 18     Temp 99.5 F (37.5 C)     Temp Source Oral     SpO2 98 %     Weight      Height 5\' 7"  (1.702 m)     Head Circumference      Peak Flow      Pain Score 3     Pain Loc      Pain Edu?      Excl. in [place]?    No data found.  Updated Vital Signs BP 113/82 (BP Location: Right Arm)   Pulse (!) 147   Temp 99.5 F (37.5 C) (Oral)   Resp 18   Ht 5\' 7"  (1.702 m)   LMP 06/02/2020   SpO2 98%   BMI 33.67 kg/m   Visual Acuity Right Eye Distance:   Left Eye Distance:   Bilateral Distance:    Right Eye Near:   Left Eye Near:    Bilateral Near:       Physical Exam Constitutional:      General: She is not in acute distress.    Appearance: She is not ill-appearing or diaphoretic.  HENT:     Head: Normocephalic and atraumatic.     Mouth/Throat:     Mouth: Mucous membranes are moist.     Pharynx: Oropharynx is clear. No oropharyngeal exudate or posterior oropharyngeal erythema.  Eyes:     General: No scleral icterus.    Conjunctiva/sclera: Conjunctivae normal.     Pupils: Pupils are equal, round, and reactive to light.  Neck:     Comments: Trachea midline, negative JVD Cardiovascular:     Rate and Rhythm: Regular rhythm. Tachycardia present.     Heart sounds: No murmur heard.  No gallop.   Pulmonary:     Effort: Pulmonary effort is normal. No respiratory distress.     Breath sounds: No wheezing, rhonchi or rales.  Musculoskeletal:     Cervical back: Neck supple. No tenderness.  Lymphadenopathy:     Cervical: No cervical adenopathy.  Skin:    Capillary Refill: Capillary refill takes less than 2 seconds.     Coloration: Skin is not jaundiced or pale.     Findings: No rash.  Neurological:     General: No focal deficit present.     Mental Status: She is alert and oriented to person, place, and time.      UC Treatments / Results  Labs (all labs ordered are listed, but only abnormal results are displayed) Labs Reviewed  COVID-19, FLU A+B AND RSV    EKG   Radiology No results found.  Procedures Procedures (including critical care time)  Medications Ordered in UC Medications - No data to display  Initial Impression / Assessment and Plan / UC Course  I have reviewed the triage vital signs and the nursing notes.  Pertinent labs & imaging results that were available during my care of the patient were reviewed by me and considered in my medical decision making (see chart for details).     Patient afebrile, nontoxic in office today.  Appears ill, though without acute distress.  Patient is tachycardic.  EKG done  office, reviewed by me without previous to compare: Sinus tachycardia with ventricular rate 150 bpm.  No QTC prolongation, ST elevation or depression.  Nonacute.  Will push fluids, treat empirically for flu while flu A/B,  Covid PCR are pending.  Return precautions discussed, pt verbalized understanding and is agreeable to plan. Final Clinical Impressions(s) / UC Diagnoses   Final diagnoses:  Encounter for screening laboratory testing for COVID-19 virus  Flu-like symptoms     Discharge Instructions     Your COVID test is pending - it is important to quarantine / isolate at home until your results are back. If you test positive and would like further evaluation for persistent or worsening symptoms, you may schedule an E-visit or virtual (video) visit throughout the [facility] app or website.  PLEASE NOTE: If you develop severe chest pain or shortness of breath please go to the ER or call [phone] for further evaluation --> DO NOT schedule electronic or virtual visits for this. Please call our office for further guidance / recommendations as needed.  For information about the Covid vaccine, please visit [email or web address]    ED Prescriptions    Medication Sig Dispense Auth. Provider   oseltamivir (TAMIFLU) 45 MG capsule Take 1 capsule (45 mg total) by mouth 2 (two) times daily for 5 days. 10 capsule Hall-Potvin, [place], PA-C     PDMP not reviewed this encounter.   Hall-Potvin, [place], [place] 06/11/20 1747

## 2020-06-12 LAB — COVID-19, FLU A+B AND RSV
Influenza A, NAA: NOT DETECTED
Influenza B, NAA: NOT DETECTED
RSV, NAA: NOT DETECTED
SARS-CoV-2, NAA: NOT DETECTED

## 2020-06-16 ENCOUNTER — Ambulatory Visit
Admission: EM | Admit: 2020-06-16 | Discharge: 2020-06-16 | Disposition: A | Discharge status: Home / Self Care | Payer: Medicaid Other | Attending: Internal Medicine | Admitting: Internal Medicine

## 2020-06-16 DIAGNOSIS — H66001 Acute suppurative otitis media without spontaneous rupture of ear drum, right ear: Secondary | ICD-10-CM

## 2020-06-16 MED ORDER — AMOXICILLIN 875 MG PO TABS: 875.0000 mg | ORAL_TABLET | Freq: Two times a day (BID) | ORAL | 0 refills | Status: DC

## 2020-06-16 NOTE — ED Triage Notes (Signed)
Pt c/o throbbing rt ear pain since last night. States rt ear feels clogged and stuffy. States seen here last week for sinus infection.

## 2020-06-16 NOTE — ED Provider Notes (Signed)
[name]    CSN: [id number] Arrival date & time: 06/16/20  0944      History   Chief Complaint Chief Complaint  Patient presents with   Otalgia    HPI Susan Vazquez is a 19 y.o. female who presents with R ear pain since last night. Her R ear also feels clogged. She had a sinus infection last week and her nose congestion is better. Has been using Flonase til 2 days ago. Denies a fever since onset of ear pain.     Past Medical History:  Diagnosis Date   Seasonal allergies     There are no problems to display for this patient.   Past Surgical History:  Procedure Laterality Date   ADENOIDECTOMY     TONSILLECTOMY     TONSILLECTOMY      OB History   No obstetric history on file.      Home Medications    Prior to Admission medications   Medication Sig Start Date End Date Taking? Authorizing Provider  albuterol (PROVENTIL HFA;VENTOLIN HFA) 108 (90 BASE) MCG/ACT inhaler Inhale 2 puffs into the lungs every 4 (four) hours as needed. For wheezing/shortness of breath. 09/04/11   Ann Maki, MD  Ascorbic Acid (VITAMIN C PO) Take 1 tablet by mouth daily.    [provider]  Cholecalciferol (VITAMIN D PO) Take 1 tablet by mouth daily.    [provider]  fluticasone (FLONASE) 50 MCG/ACT nasal spray Place 2 sprays into the nose daily.      [provider]  ibuprofen (ADVIL,MOTRIN) 200 MG tablet Take 400 mg by mouth every 6 (six) hours as needed for moderate pain.     [provider]  ipratropium (ATROVENT) 0.06 % nasal spray Place 2 sprays into both nostrils 4 (four) times daily. 09/12/13   Gregor Hams, MD  naproxen (NAPROSYN) 500 MG tablet Take 1 tablet (500 mg total) by mouth 2 (two) times daily. 07/03/19   Jaynee Eagles, PA-C  cetirizine (ZYRTEC) 10 MG tablet Take 10 mg by mouth daily.    03/04/20  [provider]    Family History Family History  Problem Relation Age of Onset   Diabetes Mother     Healthy Father     Social History Social History   Tobacco Use   Smoking status: Never Smoker   Smokeless tobacco: Never Used  Scientific laboratory technician Use: Never used  Substance Use Topics   Alcohol use: No   Drug use: Not on file     Allergies   Sulfa antibiotics   Review of Systems Review of Systems  Constitutional: Negative for chills, diaphoresis, fatigue and fever.  HENT: Positive for congestion, ear pain and postnasal drip. Negative for ear discharge, rhinorrhea, sinus pressure, sinus pain, sore throat and trouble swallowing.   Eyes: Negative for discharge.  Respiratory: Negative for cough.   Musculoskeletal: Negative for myalgias.  Skin: Negative for rash.  Neurological: Negative for headaches.  Hematological: Negative for adenopathy.   Physical Exam Triage Vital Signs ED Triage Vitals  Enc Vitals Group     BP 06/16/20 0954 123/85     Pulse Rate 06/16/20 0954 (!) 105     Resp 06/16/20 0954 20     Temp 06/16/20 0954 98.2 F (36.8 C)     Temp Source 06/16/20 0954 Oral     SpO2 06/16/20 0954 97 %     Weight --      Height --  Head Circumference --      Peak Flow --      Pain Score 06/16/20 1010 5     Pain Loc --      Pain Edu? --      Excl. in [place]? --    No data found.  Updated Vital Signs BP 123/85 (BP Location: Right Arm)    Pulse (!) 105    Temp 98.2 F (36.8 C) (Oral)    Resp 20    LMP 06/02/2020    SpO2 97%   Visual Acuity Right Eye Distance:   Left Eye Distance:   Bilateral Distance:    Right Eye Near:   Left Eye Near:    Bilateral Near:     Physical Exam Vitals and nursing note reviewed.  Constitutional:      General: She is not in acute distress.    Appearance: She is not toxic-appearing.  HENT:     Head: Normocephalic.     Right Ear: External ear normal.     Left Ear: Tympanic membrane, ear canal and external ear normal.     Ears:     Comments: R TM Is red and partly bulging    Nose: Nose normal.     Mouth/Throat:      Mouth: Mucous membranes are moist.     Pharynx: Oropharynx is clear.  Eyes:     General: No scleral icterus.    Conjunctiva/sclera: Conjunctivae normal.  Pulmonary:     Effort: Pulmonary effort is normal.  Musculoskeletal:        General: Normal range of motion.     Cervical back: Neck supple.  Lymphadenopathy:     Cervical: No cervical adenopathy.  Skin:    General: Skin is warm and dry.     Findings: No rash.  Neurological:     Mental Status: She is alert and oriented to person, place, and time.     Gait: Gait normal.  Psychiatric:        Mood and Affect: Mood normal.        Behavior: Behavior normal.        Thought Content: Thought content normal.        Judgment: Judgment normal.      UC Treatments / Results  Labs (all labs ordered are listed, but only abnormal results are displayed) Labs Reviewed - No data to display  EKG   Radiology No results found.  Procedures Procedures (including critical care time)  Medications Ordered in UC Medications - No data to display  Initial Impression / Assessment and Plan / UC Course  I have reviewed the triage vital signs and the nursing notes. Has acute R OM and I placed her on Amoxicillin as noted. Advised to get back on the Flonase for a few more days.  Final Clinical Impressions(s) / UC Diagnoses   Final diagnoses:  None   Discharge Instructions   None    ED Prescriptions    None     PDMP not reviewed this encounter.   Shelby Mattocks, PA-C 06/16/20 1158

## 2020-06-24 DIAGNOSIS — H6691 Otitis media, unspecified, right ear: Secondary | ICD-10-CM | POA: Diagnosis not present

## 2020-06-24 DIAGNOSIS — J069 Acute upper respiratory infection, unspecified: Secondary | ICD-10-CM | POA: Diagnosis not present

## 2020-07-01 DIAGNOSIS — Z419 Encounter for procedure for purposes other than remedying health state, unspecified: Secondary | ICD-10-CM | POA: Diagnosis not present

## 2020-07-02 ENCOUNTER — Other Ambulatory Visit: Payer: Self-pay

## 2020-07-02 ENCOUNTER — Ambulatory Visit (INDEPENDENT_AMBULATORY_CARE_PROVIDER_SITE_OTHER): Payer: Medicaid Other | Admitting: Adult Health

## 2020-07-02 ENCOUNTER — Encounter: Payer: Self-pay | Admitting: Adult Health

## 2020-07-02 VITALS — BP 128/88 | HR 84 | Ht 67.0 in | Wt 293.5 lb

## 2020-07-02 DIAGNOSIS — Z842 Family history of other diseases of the genitourinary system: Secondary | ICD-10-CM | POA: Diagnosis not present

## 2020-07-02 DIAGNOSIS — R635 Abnormal weight gain: Secondary | ICD-10-CM

## 2020-07-02 DIAGNOSIS — N926 Irregular menstruation, unspecified: Secondary | ICD-10-CM

## 2020-07-02 DIAGNOSIS — Z6841 Body Mass Index (BMI) 40.0 and over, adult: Secondary | ICD-10-CM

## 2020-07-02 DIAGNOSIS — Z131 Encounter for screening for diabetes mellitus: Secondary | ICD-10-CM

## 2020-07-02 HISTORY — DX: Abnormal weight gain: R63.5

## 2020-07-02 HISTORY — DX: Encounter for screening for diabetes mellitus: Z13.1

## 2020-07-02 HISTORY — DX: Irregular menstruation, unspecified: N92.6

## 2020-07-02 NOTE — Progress Notes (Signed)
  Subjective:     Patient ID: Susan Vazquez, female   DOB: 2001-06-30, 19 y.o.   MRN: [id number]  HPI Malayshia is a 19 year old white female,single, G0P0, in to discuss possible PCO. She says if she is not on OCs periods irregular and can be heavy, has some facial hair, and can't lose weight. She says her mom had PCO. PCP is Dr Creig Hines.   Review of Systems +irregualr periods Periods can be heavy +faical hair +Weight gain, can't lose Not currently sexually active. Reviewed past medical,surgical, social and family history. Reviewed medications and allergies.     Objective:   Physical Exam BP 128/88 (BP Location: Right Arm, Patient Position: Sitting, Cuff Size: Large)   Pulse 84   Ht 5\' 7"  (1.702 m)   Wt 293 lb 8 oz (133.1 kg)   LMP 06/02/2020   BMI 45.97 kg/m  Skin warm and dry. Neck: mid line trachea, normal thyroid, good ROM, no lymphadenopathy noted. Lungs: clear to ausculation bilaterally. Cardiovascular: regular rate and rhythm. Fall risk is low AA is 0 PHQ 9 score is 0   Upstream - 07/02/20 0939      Pregnancy Intention Screening   Does the patient want to become pregnant in the next year? No    Does the patient's partner want to become pregnant in the next year? No    Would the patient like to discuss contraceptive options today? No      Contraception Wrap Up   Current Method Oral Contraceptive    End Method Oral Contraceptive    Contraception Counseling Provided No             Assessment:     1. Irregular periods Continue OCs  2. Weight gain  3. Family history of PCOS  4. Screening for diabetes mellitus Check A1c  5. Morbid obesity with BMI of 45.0-49.9, adult ([place]) Continue to exercise Will try to work on weight loss     Plan:    Will check labs to day and talk when results back May add metformin  Review handout on PCO and PCO diet  Will get GYN [place] to assess ovaries She is aware will stay on OCs til ready to get pregnant   Orders Placed This  Encounter  Procedures  . US PELVIS (TRANSABDOMINAL ONLY)  . US PELVIS TRANSVAGINAL NON-OB (TV ONLY)  . CBC  . Comprehensive metabolic panel  . TSH  . Hemoglobin A1c  . Testosterone,Free and Total  . T4, free

## 2020-07-02 NOTE — Patient Instructions (Signed)

## 2020-07-04 LAB — COMPREHENSIVE METABOLIC PANEL
ALT: 15 IU/L (ref 0–32)
AST: 14 IU/L (ref 0–40)
Albumin/Globulin Ratio: 1.6 (ref 1.2–2.2)
Albumin: 4.4 g/dL (ref 3.9–5.0)
Alkaline Phosphatase: 64 IU/L (ref 42–106)
BUN/Creatinine Ratio: 14 (ref 9–23)
BUN: 9 mg/dL (ref 6–20)
Bilirubin Total: 0.3 mg/dL (ref 0.0–1.2)
CO2: 23 mmol/L (ref 20–29)
Calcium: 9.5 mg/dL (ref 8.7–10.2)
Chloride: 104 mmol/L (ref 96–106)
Creatinine, Ser: 0.65 mg/dL (ref 0.57–1.00)
GFR calc Af Amer: 148 mL/min/{1.73_m2} (ref >59)
GFR calc non Af Amer: 128 mL/min/{1.73_m2} (ref >59)
Globulin, Total: 2.8 g/dL (ref 1.5–4.5)
Glucose: 88 mg/dL (ref 65–99)
Potassium: 4.7 mmol/L (ref 3.5–5.2)
Sodium: 142 mmol/L (ref 134–144)
Total Protein: 7.2 g/dL (ref 6.0–8.5)

## 2020-07-04 LAB — T4, FREE: Free T4: 1.27 ng/dL (ref 0.82–1.77)

## 2020-07-04 LAB — CBC
Hematocrit: 37.9 % (ref 34.0–46.6)
Hemoglobin: 12.7 g/dL (ref 11.1–15.9)
MCH: 28.4 pg (ref 26.6–33.0)
MCHC: 33.5 g/dL (ref 31.5–35.7)
MCV: 85 fL (ref 79–97)
Platelets: 312 10*3/uL (ref 150–450)
RBC: 4.47 x10E6/uL (ref 3.77–5.28)
RDW: 12.6 % (ref 11.7–15.4)
WBC: 8.8 10*3/uL (ref 3.4–10.8)

## 2020-07-04 LAB — TESTOSTERONE,FREE AND TOTAL
Testosterone, Free: 1.4 pg/mL (ref 0.0–4.2)
Testosterone: 30 ng/dL (ref 13–71)

## 2020-07-04 LAB — TSH: TSH: 1.33 u[IU]/mL (ref 0.450–4.500)

## 2020-07-04 LAB — HEMOGLOBIN A1C
Est. average glucose Bld gHb Est-mCnc: 108 mg/dL
Hgb A1c MFr Bld: 5.4 % (ref 4.8–5.6)

## 2020-07-16 ENCOUNTER — Ambulatory Visit (HOSPITAL_COMMUNITY)
Admission: RE | Admit: 2020-07-16 | Discharge: 2020-07-16 | Disposition: A | Discharge status: Home / Self Care | Payer: Medicaid Other | Source: Ambulatory Visit | Attending: Adult Health | Admitting: Adult Health

## 2020-07-16 ENCOUNTER — Other Ambulatory Visit: Payer: Self-pay | Admitting: Adult Health

## 2020-07-16 ENCOUNTER — Other Ambulatory Visit: Payer: Medicaid Other

## 2020-07-16 ENCOUNTER — Other Ambulatory Visit: Payer: Self-pay

## 2020-07-16 DIAGNOSIS — E282 Polycystic ovarian syndrome: Secondary | ICD-10-CM | POA: Diagnosis not present

## 2020-07-16 DIAGNOSIS — Z842 Family history of other diseases of the genitourinary system: Secondary | ICD-10-CM | POA: Insufficient documentation

## 2020-07-16 DIAGNOSIS — N926 Irregular menstruation, unspecified: Secondary | ICD-10-CM | POA: Diagnosis not present

## 2020-07-16 DIAGNOSIS — N854 Malposition of uterus: Secondary | ICD-10-CM | POA: Diagnosis not present

## 2020-07-16 DIAGNOSIS — N946 Dysmenorrhea, unspecified: Secondary | ICD-10-CM | POA: Diagnosis not present

## 2020-08-01 DIAGNOSIS — Z419 Encounter for procedure for purposes other than remedying health state, unspecified: Secondary | ICD-10-CM | POA: Diagnosis not present

## 2020-09-01 DIAGNOSIS — Z419 Encounter for procedure for purposes other than remedying health state, unspecified: Secondary | ICD-10-CM | POA: Diagnosis not present

## 2020-09-18 ENCOUNTER — Other Ambulatory Visit: Payer: Self-pay | Admitting: Adult Health

## 2020-09-18 MED ORDER — SPRINTEC 28 0.25-35 MG-MCG PO TABS: 1.0000 | ORAL_TABLET | Freq: Every day | ORAL | 2 refills | Status: DC

## 2020-09-18 NOTE — Progress Notes (Signed)
Refilled sprintec

## 2020-09-29 DIAGNOSIS — Z419 Encounter for procedure for purposes other than remedying health state, unspecified: Secondary | ICD-10-CM | POA: Diagnosis not present

## 2020-10-30 DIAGNOSIS — Z419 Encounter for procedure for purposes other than remedying health state, unspecified: Secondary | ICD-10-CM | POA: Diagnosis not present

## 2020-11-29 DIAGNOSIS — Z419 Encounter for procedure for purposes other than remedying health state, unspecified: Secondary | ICD-10-CM | POA: Diagnosis not present

## 2020-12-03 DIAGNOSIS — Z719 Counseling, unspecified: Secondary | ICD-10-CM | POA: Diagnosis not present

## 2020-12-03 DIAGNOSIS — Z0000 Encounter for general adult medical examination without abnormal findings: Secondary | ICD-10-CM | POA: Diagnosis not present

## 2020-12-03 DIAGNOSIS — Z713 Dietary counseling and surveillance: Secondary | ICD-10-CM | POA: Diagnosis not present

## 2020-12-03 DIAGNOSIS — Z3009 Encounter for other general counseling and advice on contraception: Secondary | ICD-10-CM | POA: Diagnosis not present

## 2020-12-03 DIAGNOSIS — Z130 Encounter for screening for diseases of the blood and blood-forming organs and certain disorders involving the immune mechanism: Secondary | ICD-10-CM | POA: Diagnosis not present

## 2020-12-03 DIAGNOSIS — Z113 Encounter for screening for infections with a predominantly sexual mode of transmission: Secondary | ICD-10-CM | POA: Diagnosis not present

## 2020-12-29 DIAGNOSIS — K529 Noninfective gastroenteritis and colitis, unspecified: Secondary | ICD-10-CM | POA: Diagnosis not present

## 2020-12-29 DIAGNOSIS — J069 Acute upper respiratory infection, unspecified: Secondary | ICD-10-CM | POA: Diagnosis not present

## 2020-12-29 DIAGNOSIS — Z7185 Encounter for immunization safety counseling: Secondary | ICD-10-CM | POA: Diagnosis not present

## 2020-12-30 DIAGNOSIS — Z419 Encounter for procedure for purposes other than remedying health state, unspecified: Secondary | ICD-10-CM | POA: Diagnosis not present

## 2021-01-22 DIAGNOSIS — H538 Other visual disturbances: Secondary | ICD-10-CM | POA: Diagnosis not present

## 2021-01-29 DIAGNOSIS — Z419 Encounter for procedure for purposes other than remedying health state, unspecified: Secondary | ICD-10-CM | POA: Diagnosis not present

## 2021-02-03 DIAGNOSIS — M25571 Pain in right ankle and joints of right foot: Secondary | ICD-10-CM | POA: Diagnosis not present

## 2021-02-10 DIAGNOSIS — M25571 Pain in right ankle and joints of right foot: Secondary | ICD-10-CM | POA: Diagnosis not present

## 2021-03-01 DIAGNOSIS — Z419 Encounter for procedure for purposes other than remedying health state, unspecified: Secondary | ICD-10-CM | POA: Diagnosis not present

## 2021-03-03 DIAGNOSIS — M25571 Pain in right ankle and joints of right foot: Secondary | ICD-10-CM | POA: Diagnosis not present

## 2021-04-01 DIAGNOSIS — Z419 Encounter for procedure for purposes other than remedying health state, unspecified: Secondary | ICD-10-CM | POA: Diagnosis not present

## 2021-05-01 DIAGNOSIS — Z419 Encounter for procedure for purposes other than remedying health state, unspecified: Secondary | ICD-10-CM | POA: Diagnosis not present

## 2021-05-07 DIAGNOSIS — Z309 Encounter for contraceptive management, unspecified: Secondary | ICD-10-CM | POA: Diagnosis not present

## 2021-05-07 DIAGNOSIS — Z23 Encounter for immunization: Secondary | ICD-10-CM | POA: Diagnosis not present

## 2021-05-24 ENCOUNTER — Ambulatory Visit
Admission: RE | Admit: 2021-05-24 | Discharge: 2021-05-24 | Disposition: A | Discharge status: Home / Self Care | Payer: Medicaid Other | Source: Ambulatory Visit | Attending: Physician Assistant | Admitting: Physician Assistant

## 2021-05-24 ENCOUNTER — Other Ambulatory Visit: Payer: Self-pay

## 2021-05-24 VITALS — BP 132/85 | HR 103 | Temp 98.2°F | Resp 18

## 2021-05-24 DIAGNOSIS — Z20822 Contact with and (suspected) exposure to covid-19: Secondary | ICD-10-CM | POA: Insufficient documentation

## 2021-05-24 DIAGNOSIS — H65193 Other acute nonsuppurative otitis media, bilateral: Secondary | ICD-10-CM | POA: Diagnosis not present

## 2021-05-24 DIAGNOSIS — J069 Acute upper respiratory infection, unspecified: Secondary | ICD-10-CM | POA: Insufficient documentation

## 2021-05-24 LAB — POCT RAPID STREP A (OFFICE): Rapid Strep A Screen: NEGATIVE

## 2021-05-24 MED ORDER — AMOXICILLIN 500 MG PO CAPS: 500.0000 mg | ORAL_CAPSULE | Freq: Three times a day (TID) | ORAL | 0 refills | Status: DC

## 2021-05-24 NOTE — ED Provider Notes (Signed)
[name]    CSN: [id number] Arrival date & time: 05/24/21  0841      History   Chief Complaint Chief Complaint  Patient presents with   Sore Throat    HPI Susan Vazquez is a 20 y.o. female.   Patient here today for evaluation of sore throat, and bilateral ear pain that started yesterday.  She reports she is prone to ear infections and has gotten them since she was a child.  She has had low-grade fever.  Over-the-counter medications did help to break fever last night.  She has had mild cough.  She denies any vomiting or diarrhea.  The history is provided by the patient.  Sore Throat Pertinent negatives include no abdominal pain and no shortness of breath.   Past Medical History:  Diagnosis Date   Seasonal allergies     Patient Active Problem List   Diagnosis Date Noted   Weight gain 07/02/2020   Irregular periods 07/02/2020   Family history of PCOS 07/02/2020   Morbid obesity with BMI of 45.0-49.9, adult ([place]) 07/02/2020   Screening for diabetes mellitus 07/02/2020    Past Surgical History:  Procedure Laterality Date   ADENOIDECTOMY     TONSILLECTOMY     TONSILLECTOMY     WISDOM TOOTH EXTRACTION      OB History     Gravida  0   Para  0   Term  0   Preterm  0   AB  0   Living  0      SAB  0   IAB  0   Ectopic  0   Multiple  0   Live Births  0            Home Medications    Prior to Admission medications   Medication Sig Start Date End Date Taking? Authorizing Provider  amoxicillin (AMOXIL) 500 MG capsule Take 1 capsule (500 mg total) by mouth 3 (three) times daily. 05/24/21  Yes Francene Finders, PA-C  Ascorbic Acid (VITAMIN C PO) Take 1 tablet by mouth daily.    [provider]  Cholecalciferol (VITAMIN D PO) Take 1 tablet by mouth.     [provider]  diphenhydrAMINE HCl (BENADRYL PO) Take by mouth.    [provider]  fluticasone (FLONASE) 50 MCG/ACT nasal spray Place 2 sprays into  the nose daily.      [provider]  Loratadine (CLARITIN PO) Take by mouth.    [provider]  Vance 28 0.25-35 MG-MCG tablet Take 1 tablet by mouth daily. 09/18/20   Estill Dooms, NP  cetirizine (ZYRTEC) 10 MG tablet Take 10 mg by mouth daily.    03/04/20  [provider]    Family History Family History  Problem Relation Age of Onset   Diabetes Mother    Cancer Father        stage 3 melonoma cancer   Suicidality Maternal Grandfather     Social History Social History   Tobacco Use   Smoking status: Never   Smokeless tobacco: Never  Vaping Use   Vaping Use: Former  Substance Use Topics   Alcohol use: No   Drug use: Never     Allergies   Sulfa antibiotics   Review of Systems Review of Systems  Constitutional:  Positive for fever. Negative for chills.  HENT:  Positive for congestion, ear pain, sinus pressure and sore throat.   Eyes:  Negative for discharge and  redness.  Respiratory:  Positive for cough. Negative for shortness of breath and wheezing.   Gastrointestinal:  Negative for abdominal pain, diarrhea, nausea and vomiting.    Physical Exam Triage Vital Signs ED Triage Vitals [05/24/21 0908]  Enc Vitals Group     BP 132/85     Pulse Rate (!) 103     Resp 18     Temp 98.2 F (36.8 C)     Temp Source Oral     SpO2 97 %     Weight      Height      Head Circumference      Peak Flow      Pain Score 5     Pain Loc      Pain Edu?      Excl. in [place]?    No data found.  Updated Vital Signs BP 132/85 (BP Location: Left Arm)   Pulse (!) 103   Temp 98.2 F (36.8 C) (Oral)   Resp 18   LMP 05/03/2021   SpO2 97%      Physical Exam Vitals and nursing note reviewed.  Constitutional:      General: She is not in acute distress.    Appearance: Normal appearance. She is not ill-appearing.  HENT:     Head: Normocephalic and atraumatic.     Right Ear: Tympanic membrane is erythematous.     Left Ear: Tympanic membrane is  erythematous.     Nose: Congestion present.     Mouth/Throat:     Mouth: Mucous membranes are moist.     Pharynx: No oropharyngeal exudate or posterior oropharyngeal erythema.  Eyes:     Conjunctiva/sclera: Conjunctivae normal.  Cardiovascular:     Rate and Rhythm: Normal rate and regular rhythm.     Heart sounds: Normal heart sounds. No murmur heard. Pulmonary:     Effort: Pulmonary effort is normal. No respiratory distress.     Breath sounds: Normal breath sounds. No wheezing, rhonchi or rales.  Skin:    General: Skin is warm and dry.  Neurological:     Mental Status: She is alert.  Psychiatric:        Mood and Affect: Mood normal.        Thought Content: Thought content normal.     UC Treatments / Results  Labs (all labs ordered are listed, but only abnormal results are displayed) Labs Reviewed  COVID-19, FLU A+B NAA  CULTURE, GROUP A STREP [facility])  POCT RAPID STREP A (OFFICE)    EKG   Radiology No results found.  Procedures Procedures (including critical care time)  Medications Ordered in UC Medications - No data to display  Initial Impression / Assessment and Plan / UC Course  I have reviewed the triage vital signs and the nursing notes.  Pertinent labs & imaging results that were available during my care of the patient were reviewed by me and considered in my medical decision making (see chart for details).  Will treat to cover otitis media with amoxicillin.  Strep test negative in office but will order culture.  COVID and flu screening also ordered.  Encouraged follow-up with any further concerns.  Final Clinical Impressions(s) / UC Diagnoses   Final diagnoses:  Encounter for screening laboratory testing for COVID-19 virus  Acute upper respiratory infection  Other acute nonsuppurative otitis media of both ears, recurrence not specified   Discharge Instructions   None    ED Prescriptions     Medication Sig Dispense  Auth. Provider   amoxicillin  (AMOXIL) 500 MG capsule Take 1 capsule (500 mg total) by mouth 3 (three) times daily. 21 capsule Francene Finders, PA-C      PDMP not reviewed this encounter.   Francene Finders, PA-C 05/24/21 1021

## 2021-05-24 NOTE — ED Triage Notes (Signed)
Pt c/o sore throat, fever, congestion, and bilateral eye pain/pressure since yesterday. States hx of strep and ear infections. Requesting covid/flu testing.

## 2021-05-25 LAB — COVID-19, FLU A+B NAA
Influenza A, NAA: NOT DETECTED
Influenza B, NAA: NOT DETECTED
SARS-CoV-2, NAA: NOT DETECTED

## 2021-05-27 LAB — CULTURE, GROUP A STREP (THRC)

## 2021-06-01 DIAGNOSIS — Z419 Encounter for procedure for purposes other than remedying health state, unspecified: Secondary | ICD-10-CM | POA: Diagnosis not present

## 2021-06-07 ENCOUNTER — Encounter: Payer: Self-pay | Admitting: Emergency Medicine

## 2021-06-07 ENCOUNTER — Ambulatory Visit
Admission: EM | Admit: 2021-06-07 | Discharge: 2021-06-07 | Disposition: A | Discharge status: Home / Self Care | Payer: Medicaid Other | Attending: Physician Assistant | Admitting: Physician Assistant

## 2021-06-07 DIAGNOSIS — J019 Acute sinusitis, unspecified: Secondary | ICD-10-CM

## 2021-06-07 MED ORDER — DOXYCYCLINE HYCLATE 100 MG PO CAPS: 100.0000 mg | ORAL_CAPSULE | Freq: Two times a day (BID) | ORAL | 0 refills | Status: DC

## 2021-06-07 NOTE — ED Triage Notes (Signed)
Patient c/o possible sinus infection, persistent cough worse at night.  Just treated for an ear infection, finished meds now sx's are back.

## 2021-06-08 ENCOUNTER — Encounter: Payer: Self-pay | Admitting: Physician Assistant

## 2021-06-08 NOTE — ED Provider Notes (Signed)
[facility]    CSN: [id number] Arrival date & time: 06/07/21  1833      History   Chief Complaint Chief Complaint  Patient presents with   Facial Pain    HPI Susan Vazquez is a 20 y.o. female.   Patient here today for evaluation of continued sinus congestion.  She was recently treated for ear infection and reports that the symptoms did improve but amoxicillin did not clear all of the sinus congestion and pressure.  She has had some fever that was low-grade over the weekend.  She does not report any vomiting or diarrhea.  The history is provided by the patient.   Past Medical History:  Diagnosis Date   Seasonal allergies     Patient Active Problem List   Diagnosis Date Noted   Weight gain 07/02/2020   Irregular periods 07/02/2020   Family history of PCOS 07/02/2020   Morbid obesity with BMI of 45.0-49.9, adult ([place]) 07/02/2020   Screening for diabetes mellitus 07/02/2020    Past Surgical History:  Procedure Laterality Date   ADENOIDECTOMY     TONSILLECTOMY     TONSILLECTOMY     WISDOM TOOTH EXTRACTION      OB History     Gravida  0   Para  0   Term  0   Preterm  0   AB  0   Living  0      SAB  0   IAB  0   Ectopic  0   Multiple  0   Live Births  0            Home Medications    Prior to Admission medications   Medication Sig Start Date End Date Taking? Authorizing Provider  Ascorbic Acid (VITAMIN C PO) Take 1 tablet by mouth daily.   Yes [provider]  Cholecalciferol (VITAMIN D PO) Take 1 tablet by mouth.    Yes [provider]  diphenhydrAMINE HCl (BENADRYL PO) Take by mouth.   Yes [provider]  doxycycline (VIBRAMYCIN) 100 MG capsule Take 1 capsule (100 mg total) by mouth 2 (two) times daily. 06/07/21  Yes Francene Finders, PA-C  fluticasone (FLONASE) 50 MCG/ACT nasal spray Place 2 sprays into the nose daily.     Yes [provider]  Loratadine (CLARITIN PO) Take by mouth.    Yes [provider]  [place] 28 0.25-35 MG-MCG tablet Take 1 tablet by mouth daily. 09/18/20  Yes Derrek Monaco A, NP  cetirizine (ZYRTEC) 10 MG tablet Take 10 mg by mouth daily.    03/04/20  [provider]    Family History Family History  Problem Relation Age of Onset   Diabetes Mother    Cancer Father        stage 3 melonoma cancer   Suicidality Maternal Grandfather     Social History Social History   Tobacco Use   Smoking status: Never   Smokeless tobacco: Never  Vaping Use   Vaping Use: Former  Substance Use Topics   Alcohol use: No   Drug use: Never     Allergies   Sulfa antibiotics   Review of Systems Review of Systems  Constitutional:  Positive for fever.  HENT:  Positive for congestion and sinus pressure. Negative for ear pain and sore throat.   Eyes:  Negative for discharge and redness.  Respiratory:  Positive for cough. Negative for shortness of breath and wheezing.   Gastrointestinal:  Negative  for abdominal pain, diarrhea, nausea and vomiting.    Physical Exam Triage Vital Signs ED Triage Vitals  Enc Vitals Group     BP 06/07/21 2001 101/78     Pulse Rate 06/07/21 2001 86     Resp --      Temp 06/07/21 2001 98.6 F (37 C)     Temp Source 06/07/21 2001 Oral     SpO2 06/07/21 2001 98 %     Weight --      Height 06/07/21 2004 5\' 6"  (1.676 m)     Head Circumference --      Peak Flow --      Pain Score 06/07/21 2003 4     Pain Loc --      Pain Edu? --      Excl. in [place]? --    No data found.  Updated Vital Signs BP 101/78 (BP Location: Left Arm)   Pulse 86   Temp 98.6 F (37 C) (Oral)   Ht 5\' 6"  (1.676 m)   LMP 06/03/2021   SpO2 98%   BMI 47.37 kg/m   Physical Exam Vitals and nursing note reviewed.  Constitutional:      General: She is not in acute distress.    Appearance: Normal appearance. She is not ill-appearing.  HENT:     Head: Normocephalic and atraumatic.     Nose: Congestion present.  Eyes:      Conjunctiva/sclera: Conjunctivae normal.  Cardiovascular:     Rate and Rhythm: Normal rate.  Pulmonary:     Effort: Pulmonary effort is normal. No respiratory distress.  Skin:    General: Skin is warm and dry.  Neurological:     Mental Status: She is alert.  Psychiatric:        Mood and Affect: Mood normal.        Thought Content: Thought content normal.     UC Treatments / Results  Labs (all labs ordered are listed, but only abnormal results are displayed) Labs Reviewed - No data to display  EKG   Radiology No results found.  Procedures Procedures (including critical care time)  Medications Ordered in UC Medications - No data to display  Initial Impression / Assessment and Plan / UC Course  I have reviewed the triage vital signs and the nursing notes.  Pertinent labs & imaging results that were available during my care of the patient were reviewed by me and considered in my medical decision making (see chart for details).   Doxycycline prescribed for hopeful improvement of sinus symptoms. Recommend follow up with any further concerns.   Final Clinical Impressions(s) / UC Diagnoses   Final diagnoses:  Acute sinusitis, recurrence not specified, unspecified location   Discharge Instructions   None    ED Prescriptions     Medication Sig Dispense Auth. Provider   doxycycline (VIBRAMYCIN) 100 MG capsule Take 1 capsule (100 mg total) by mouth 2 (two) times daily. 20 capsule Francene Finders, PA-C      PDMP not reviewed this encounter.   Francene Finders, PA-C 06/08/21 [phone]

## 2021-06-11 ENCOUNTER — Telehealth: Payer: Medicaid Other | Admitting: Emergency Medicine

## 2021-06-11 DIAGNOSIS — J069 Acute upper respiratory infection, unspecified: Secondary | ICD-10-CM

## 2021-06-11 MED ORDER — PREDNISONE 50 MG PO TABS: 50.0000 mg | ORAL_TABLET | Freq: Every day | ORAL | 0 refills | Status: DC

## 2021-06-11 NOTE — Patient Instructions (Signed)
Susan Vazquez, thank you for joining Carvel Getting, NP for today's virtual visit.  While this provider is not your primary care provider (PCP), if your PCP is located in our provider database this encounter information will be shared with them immediately following your visit.  Consent: (Patient) Susan Vazquez provided verbal consent for this virtual visit at the beginning of the encounter.  Current Medications:  Current Outpatient Medications:    predniSONE (DELTASONE) 50 MG tablet, Take 1 tablet (50 mg total) by mouth daily with breakfast., Disp: 5 tablet, Rfl: 0   Ascorbic Acid (VITAMIN C PO), Take 1 tablet by mouth daily., Disp: , Rfl:    Cholecalciferol (VITAMIN D PO), Take 1 tablet by mouth. , Disp: , Rfl:    diphenhydrAMINE HCl (BENADRYL PO), Take by mouth., Disp: , Rfl:    doxycycline (VIBRAMYCIN) 100 MG capsule, Take 1 capsule (100 mg total) by mouth 2 (two) times daily., Disp: 20 capsule, Rfl: 0   fluticasone (FLONASE) 50 MCG/ACT nasal spray, Place 2 sprays into the nose daily.  , Disp: , Rfl:    Loratadine (CLARITIN PO), Take by mouth., Disp: , Rfl:    SPRINTEC 28 0.25-35 MG-MCG tablet, Take 1 tablet by mouth daily., Disp: 28 tablet, Rfl: 2   Medications ordered in this encounter:  Meds ordered this encounter  Medications   predniSONE (DELTASONE) 50 MG tablet    Sig: Take 1 tablet (50 mg total) by mouth daily with breakfast.    Dispense:  5 tablet    Refill:  0     *If you need refills on other medications prior to your next appointment, please contact your pharmacy*  Follow-Up: Call back or seek an in-person evaluation if the symptoms worsen or if the condition fails to improve as anticipated.  Other Instructions Consider using a Neti pot or other saline irrigation.  You can buy salt specifically for Neti pots, or you can use kosher salt.  You do not want to use table salt as it contains iodine and you do not want iodine in your nose.  Use distilled water instead of  tap water.  Saline irrigation systems like Neti pots should come with instructions on how to mix the water and salt, but if it does not, the recipe is 1 cup of warm distilled water with 1/2 teaspoon Neti pot salt or kosher salt mixed into it.  You can use the Neti pot several times a day, as many as you need to help relieve the congestion in your head and the pressure in your ear.  Continue using your fluticasone nasal spray, loratadine allergy pill, and a decongestant such as Sudafed.  We did not discuss this, but you might consider adding guaifenesin (brand-name Mucinex) to your medication list for now.   If you have been instructed to have an in-person evaluation today at a local Urgent Care facility, please use the link below. It will take you to a list of all of our available [facility], including address, phone number and hours of operation. Please do not delay care.  [facility]  If you or a family member do not have a primary care provider, use the link below to schedule a visit and establish care. When you choose a [facility] primary care physician or advanced practice provider, you gain a long-term partner in health. Find a Primary Care Provider  Learn more about 's in-office and virtual care options:  -  Get Care Now

## 2021-06-11 NOTE — Progress Notes (Signed)
Virtual Visit Consent   Susan Vazquez, you are scheduled for a virtual visit with a [facility] provider today.     Just as with appointments in the office, your consent must be obtained to participate.  Your consent will be active for this visit and any virtual visit you may have with one of our providers in the next 365 days.     If you have a MyChart account, a copy of this consent can be sent to you electronically.  All virtual visits are billed to your insurance company just like a traditional visit in the office.    As this is a virtual visit, video technology does not allow for your provider to perform a traditional examination.  This may limit your provider's ability to fully assess your condition.  If your provider identifies any concerns that need to be evaluated in person or the need to arrange testing (such as labs, EKG, etc.), we will make arrangements to do so.     Although advances in technology are sophisticated, we cannot ensure that it will always work on either your end or our end.  If the connection with a video visit is poor, the visit may have to be switched to a telephone visit.  With either a video or telephone visit, we are not always able to ensure that we have a secure connection.     I need to obtain your verbal consent now.   Are you willing to proceed with your visit today?    Susan Vazquez has provided verbal consent on 06/11/2021 for a virtual visit (video or telephone).   Carvel Getting, NP   Date: 06/11/2021 8:31 AM   Virtual Visit via Video Note   I, Carvel Getting, connected with  Susan Vazquez  ([id number], 09-27-2000) on 06/11/21 at  8:15 AM EST by a video-enabled telemedicine application and verified that I am speaking with the correct person using two identifiers.  Location: Patient: Virtual Visit Location Patient: Home Provider: Virtual Visit Location Provider: Home Office   I discussed the limitations of evaluation and management by  telemedicine and the availability of in person appointments. The patient expressed understanding and agreed to proceed.    History of Present Illness: Susan Vazquez is a 20 y.o. who identifies as a female who was assigned female at birth, and is being seen today for over 3 weeks of nasal congestion.  She was seen in urgent care October 24 for congestion and ear infection, she was given amoxicillin.  She was seen again in urgent care on November 7 and treated for sinus infection with doxycycline.  She feels somewhat better, but her congestion remains and she still has pressure in her right ear.  She has been using fluticasone nasal spray, loratadine, and Sudafed at home to help manage symptoms but they are not going away.  This morning she also has an ulcer under her tongue.  She denies fever.  She does feel like she is getting better she just has persistent congestion.  Denies shortness of breath or wheezing.  Does report cough and postnasal drainage.  HPI: HPI  Problems:  Patient Active Problem List   Diagnosis Date Noted   Weight gain 07/02/2020   Irregular periods 07/02/2020   Family history of PCOS 07/02/2020   Morbid obesity with BMI of 45.0-49.9, adult ([place]) 07/02/2020   Screening for diabetes mellitus 07/02/2020    Allergies:  Allergies  Allergen Reactions   Sulfa  Antibiotics Rash   Medications:  Current Outpatient Medications:    predniSONE (DELTASONE) 50 MG tablet, Take 1 tablet (50 mg total) by mouth daily with breakfast., Disp: 5 tablet, Rfl: 0   Ascorbic Acid (VITAMIN C PO), Take 1 tablet by mouth daily., Disp: , Rfl:    Cholecalciferol (VITAMIN D PO), Take 1 tablet by mouth. , Disp: , Rfl:    diphenhydrAMINE HCl (BENADRYL PO), Take by mouth., Disp: , Rfl:    doxycycline (VIBRAMYCIN) 100 MG capsule, Take 1 capsule (100 mg total) by mouth 2 (two) times daily., Disp: 20 capsule, Rfl: 0   fluticasone (FLONASE) 50 MCG/ACT nasal spray, Place 2 sprays into the nose daily.  , Disp:  , Rfl:    Loratadine (CLARITIN PO), Take by mouth., Disp: , Rfl:    SPRINTEC 28 0.25-35 MG-MCG tablet, Take 1 tablet by mouth daily., Disp: 28 tablet, Rfl: 2  Observations/Objective: Patient is well-developed, well-nourished in no acute distress.  Resting comfortably  at home.  Head is normocephalic, atraumatic.  No labored breathing.  Speech is clear and coherent with logical content.  Patient is alert and oriented at baseline.    Assessment and Plan: 1. Viral upper respiratory tract infection  Patient has recently had 2 antibiotics, amoxicillin and doxycycline, for potential ear and sinus infections.  This is somewhat improved her symptoms but not resolved her congestion and her pressure in her ears completely.  We discussed at length the use of saline irrigation to help manage her congestion and relieve the pressure in her ear.  She will continue her over-the-counter medications and I will add prednisone to try to help decrease the inflammation in her nasal passages and sinuses.   Follow Up Instructions: I discussed the assessment and treatment plan with the patient. The patient was provided an opportunity to ask questions and all were answered. The patient agreed with the plan and demonstrated an understanding of the instructions.  A copy of instructions were sent to the patient via MyChart unless otherwise noted below.   The patient was advised to call back or seek an in-person evaluation if the symptoms worsen or if the condition fails to improve as anticipated.  Time:  I spent 14 minutes with the patient via telehealth technology discussing the above problems/concerns.    Carvel Getting, NP

## 2021-06-12 ENCOUNTER — Other Ambulatory Visit: Payer: Self-pay

## 2021-06-12 ENCOUNTER — Emergency Department
Admission: EM | Admit: 2021-06-12 | Discharge: 2021-06-12 | Disposition: A | Discharge status: Home / Self Care | Payer: Medicaid Other | Attending: Emergency Medicine | Admitting: Emergency Medicine

## 2021-06-12 ENCOUNTER — Emergency Department: Payer: Medicaid Other

## 2021-06-12 DIAGNOSIS — K1120 Sialoadenitis, unspecified: Secondary | ICD-10-CM

## 2021-06-12 DIAGNOSIS — J101 Influenza due to other identified influenza virus with other respiratory manifestations: Secondary | ICD-10-CM | POA: Diagnosis not present

## 2021-06-12 DIAGNOSIS — Z20822 Contact with and (suspected) exposure to covid-19: Secondary | ICD-10-CM | POA: Diagnosis not present

## 2021-06-12 DIAGNOSIS — R600 Localized edema: Secondary | ICD-10-CM | POA: Diagnosis not present

## 2021-06-12 DIAGNOSIS — K111 Hypertrophy of salivary gland: Secondary | ICD-10-CM | POA: Diagnosis not present

## 2021-06-12 DIAGNOSIS — J029 Acute pharyngitis, unspecified: Secondary | ICD-10-CM | POA: Diagnosis not present

## 2021-06-12 DIAGNOSIS — R0981 Nasal congestion: Secondary | ICD-10-CM | POA: Diagnosis present

## 2021-06-12 LAB — CBC WITH DIFFERENTIAL/PLATELET
Abs Immature Granulocytes: 0.04 10*3/uL (ref 0.00–0.07)
Basophils Absolute: 0 10*3/uL (ref 0.0–0.1)
Basophils Relative: 0 %
Eosinophils Absolute: 0 10*3/uL (ref 0.0–0.5)
Eosinophils Relative: 0 %
HCT: 41.4 % (ref 36.0–46.0)
Hemoglobin: 13.7 g/dL (ref 12.0–15.0)
Immature Granulocytes: 0 %
Lymphocytes Relative: 28 %
Lymphs Abs: 2.6 10*3/uL (ref 0.7–4.0)
MCH: 28.5 pg (ref 26.0–34.0)
MCHC: 33.1 g/dL (ref 30.0–36.0)
MCV: 86.3 fL (ref 80.0–100.0)
Monocytes Absolute: 0.6 10*3/uL (ref 0.1–1.0)
Monocytes Relative: 6 %
Neutro Abs: 6 10*3/uL (ref 1.7–7.7)
Neutrophils Relative %: 66 %
Platelets: 330 10*3/uL (ref 150–400)
RBC: 4.8 MIL/uL (ref 3.87–5.11)
RDW: 12.4 % (ref 11.5–15.5)
WBC: 9.3 10*3/uL (ref 4.0–10.5)
nRBC: 0.2 % (ref 0.0–0.2)

## 2021-06-12 LAB — POC URINE PREG, ED
Preg Test, Ur: NEGATIVE
Preg Test, Ur: NEGATIVE

## 2021-06-12 LAB — RESP PANEL BY RT-PCR (FLU A&B, COVID) ARPGX2
Influenza A by PCR: POSITIVE — AB
Influenza B by PCR: NEGATIVE
SARS Coronavirus 2 by RT PCR: NEGATIVE

## 2021-06-12 LAB — BASIC METABOLIC PANEL
Anion gap: 10 (ref 5–15)
BUN: 10 mg/dL (ref 6–20)
CO2: 27 mmol/L (ref 22–32)
Calcium: 9.5 mg/dL (ref 8.9–10.3)
Chloride: 102 mmol/L (ref 98–111)
Creatinine, Ser: 0.61 mg/dL (ref 0.44–1.00)
GFR, Estimated: >60 mL/min (ref >60)
Glucose, Bld: 95 mg/dL (ref 70–99)
Potassium: 3.5 mmol/L (ref 3.5–5.1)
Sodium: 139 mmol/L (ref 135–145)

## 2021-06-12 LAB — MONONUCLEOSIS SCREEN: Mono Screen: NEGATIVE

## 2021-06-12 MED ORDER — HYDROCODONE-ACETAMINOPHEN 5-325 MG PO TABS: 1.0000 | ORAL_TABLET | Freq: Four times a day (QID) | ORAL | 0 refills | Status: DC | PRN

## 2021-06-12 MED ORDER — PREDNISONE 10 MG PO TABS: ORAL_TABLET | ORAL | 0 refills | Status: DC

## 2021-06-12 MED ORDER — IOHEXOL 300 MG/ML  SOLN
75.0000 mL | Freq: Once | INTRAMUSCULAR | Status: AC | PRN
  Administered 2021-06-12: 75 mL via INTRAVENOUS
  Filled 2021-06-12: qty 75

## 2021-06-12 MED ORDER — AMOXICILLIN-POT CLAVULANATE 875-125 MG PO TABS: 1.0000 | ORAL_TABLET | Freq: Two times a day (BID) | ORAL | 0 refills | Status: AC

## 2021-06-12 NOTE — Discharge Instructions (Signed)
Call make a follow-up appoint with Dr. Richardson Landry who is on-call for [facility].  Begin taking antibiotics Augmentin and prednisone.  There is also a prescription for hydrocodone if needed for moderate to severe pain.  Is very important to try some sour candy especially lemon drops to make your salivary gland secrete saliva which may help reduce the pain on the right side of your face.

## 2021-06-12 NOTE — ED Provider Notes (Signed)
[facility] Emergency Department Provider Note  ____________________________________________   Event Date/Time   First MD Initiated Contact with Patient 06/12/21 [phone]     (approximate)  I have reviewed the triage vital signs and the nursing notes.   HISTORY  Chief Complaint Nasal Congestion   HPI Susan Vazquez is a 20 y.o. female presents to the ED with complaint of several weeks of feeling ill.  Patient states she initially had a right otitis media which was treated with 5 to 7 days of amoxicillin.  She states that it was getting better but did not fully resolve.  She again went to a urgent care who then placed her on doxycycline which did not help at all.  Yesterday they added prednisone to her medications and this morning she woke with her lymph nodes feeling swollen.  Initially she had fever but is not aware of any fever in the last several days.  She denies any nausea or vomiting.         Past Medical History:  Diagnosis Date   Seasonal allergies     Patient Active Problem List   Diagnosis Date Noted   Weight gain 07/02/2020   Irregular periods 07/02/2020   Family history of PCOS 07/02/2020   Morbid obesity with BMI of 45.0-49.9, adult ([place]) 07/02/2020   Screening for diabetes mellitus 07/02/2020    Past Surgical History:  Procedure Laterality Date   ADENOIDECTOMY     TONSILLECTOMY     TONSILLECTOMY     WISDOM TOOTH EXTRACTION      Prior to Admission medications   Medication Sig Start Date End Date Taking? Authorizing Provider  amoxicillin-clavulanate (AUGMENTIN) 875-125 MG tablet Take 1 tablet by mouth 2 (two) times daily for 10 days. 06/12/21 06/22/21 Yes Johnn Hai, PA-C  HYDROcodone-acetaminophen (NORCO/VICODIN) 5-325 MG tablet Take 1 tablet by mouth every 6 (six) hours as needed for moderate pain. 06/12/21 06/12/22 Yes Merlina Marchena L, PA-C  predniSONE (DELTASONE) 10 MG tablet Take 6 tablets for 2 days, take 5 tablets  for 2 days, 4 tablets for 2 days, 3 tabs for 2 days and 2 tabs for 2 days and then 1 each day until finished 06/12/21  Yes Letitia Neri L, PA-C  Ascorbic Acid (VITAMIN C PO) Take 1 tablet by mouth daily.    [provider]  Cholecalciferol (VITAMIN D PO) Take 1 tablet by mouth.     [provider]  diphenhydrAMINE HCl (BENADRYL PO) Take by mouth.    [provider]  fluticasone (FLONASE) 50 MCG/ACT nasal spray Place 2 sprays into the nose daily.      [provider]  Loratadine (CLARITIN PO) Take by mouth.    [provider]  [place] 28 0.25-35 MG-MCG tablet Take 1 tablet by mouth daily. 09/18/20   Estill Dooms, NP  cetirizine (ZYRTEC) 10 MG tablet Take 10 mg by mouth daily.    03/04/20  [provider]    Allergies Sulfa antibiotics  Family History  Problem Relation Age of Onset   Diabetes Mother    Cancer Father        stage 3 melonoma cancer   Suicidality Maternal Grandfather     Social History Social History   Tobacco Use   Smoking status: Never   Smokeless tobacco: Never  Vaping Use   Vaping Use: Former  Substance Use Topics   Alcohol use: No   Drug use: Never    Review of Systems Constitutional:  No fever/chills Eyes: No visual changes. ENT: No sore throat.  Positive right-sided mouth pain and right maxillary pain. Cardiovascular: Denies chest pain. Respiratory: Denies shortness of breath. Gastrointestinal: No abdominal pain.  No nausea, no vomiting.   Musculoskeletal: Negative for back pain. Skin: Negative for rash. Neurological: Negative for headaches, focal weakness or numbness.  ____________________________________________   PHYSICAL EXAM:  VITAL SIGNS: ED Triage Vitals  Enc Vitals Group     BP 06/12/21 0748 115/76     Pulse Rate 06/12/21 0748 78     Resp 06/12/21 0748 18     Temp 06/12/21 0748 98.2 F (36.8 C)     Temp Source 06/12/21 0748 Oral     SpO2 06/12/21 0748 98 %     Weight  06/12/21 0750 290 lb (131.5 kg)     Height 06/12/21 0750 5\' 6"  (1.676 m)     Head Circumference --      Peak Flow --      Pain Score 06/12/21 0750 0     Pain Loc --      Pain Edu? --      Excl. in [place]? --     Constitutional: Alert and oriented. Well appearing and in no acute distress. Eyes: Conjunctivae are normal. PERRL. EOMI. Head: Atraumatic. Nose: No congestion/rhinnorhea. Mouth/Throat: Mucous membranes are moist.  Oropharynx non-erythematous.  No exudate noted. Neck: No stridor.  Supple. Hematological/Lymphatic/Immunilogical: Positive right sided cervical lymphadenopathy. Cardiovascular: Normal rate, regular rhythm. Grossly normal heart sounds.  Good peripheral circulation. Respiratory: Normal respiratory effort.  No retractions. Lungs CTAB. Gastrointestinal: Soft and nontender. No distention.  Musculoskeletal: Moves upper and lower extremities no difficulty normal gait was noted. Neurologic:  Normal speech and language. No gross focal neurologic deficits are appreciated. No gait instability. Skin:  Skin is warm, dry and intact. No rash noted. Psychiatric: Mood and affect are normal. Speech and behavior are normal.  ____________________________________________   LABS (all labs ordered are listed, but only abnormal results are displayed)  Labs Reviewed  RESP PANEL BY RT-PCR (FLU A&B, COVID) ARPGX2 - Abnormal; Notable for the following components:      Result Value   Influenza A by PCR POSITIVE (*)    All other components within normal limits  CBC WITH DIFFERENTIAL/PLATELET  BASIC METABOLIC PANEL  MONONUCLEOSIS SCREEN  POC URINE PREG, ED  POC URINE PREG, ED   ____________________________________________    RADIOLOGY Leana Gamer, personally viewed and evaluated these images (plain radiographs) as part of my medical decision making, as well as reviewing the written report by the radiologist.   Official radiology report(s): CT SOFT TISSUE NECK W  CONTRAST  Result Date: 06/12/2021 CLINICAL DATA:  Maxillofacial pain EXAM: CT NECK WITH CONTRAST TECHNIQUE: Multidetector CT imaging of the neck was performed using the standard protocol following the bolus administration of intravenous contrast. CONTRAST:  93mL OMNIPAQUE IOHEXOL 300 MG/ML  SOLN COMPARISON:  None. FINDINGS: Pharynx and larynx: Inflammatory changes associated with the right floor of mouth and submandibular gland are described below. Unremarkable appearance of the larynx. No evidence of a mass. Salivary glands: Enlarged and edematous right submandibular gland with surrounding edema in the submandibular space. There also is edema tracking along the right floor of the mouth with asymmetrically enlarged right mylohyoid.No calculus or discrete, drainable fluid collection identified. The left submandibular gland and bilateral parotid glands are within normal limits. Thyroid: Normal. Lymph nodes: No pathologically enlarged lymph nodes. Mildly prominent jugulodigastric and right submandibular nodes, nonspecific but potentially reactive  given above findings. Vascular: Limited evaluation due to non arterial timing. Great vessels appear grossly patent. Suspected non dominant/small right vertebral artery. Limited intracranial: Negative. Visualized orbits: Negative. Mastoids and visualized paranasal sinuses: Moderate mucosal thickening of the inferior maxillary sinuses, scattered ethmoid air cells and left greater than right sphenoid sinuses. Skeleton: Reversal of the normal cervical lordosis. No evidence of acute abnormality on limited assessment. Upper chest: Visualized lung apices are clear. Other: Right middle ear and mastoid effusion IMPRESSION: 1. Findings compatible with acute right submandibular sialadenitis. Edema in the surrounding submandibular space, potentially cellulitis or reactive. Additionally, there is soft tissue thickening and edema involving the right floor mouth, suspicious for direct  spread of infection with possible inflammation/thickening of the submandibular duct. No definite mass lesion; however, recommend correlation with direct inspection of the floor of mouth. No calculus or discrete, drainable fluid collection identified. 2. Moderate paranasal sinus mucosal thickening, detailed above. 3. Right mastoid and middle ear effusion. Electronically Signed   By: Margaretha Sheffield M.D.   On: 06/12/2021 13:42    ____________________________________________   PROCEDURES  Procedure(s) performed (including Critical Care):  Procedures   ____________________________________________   INITIAL IMPRESSION / ASSESSMENT AND PLAN / ED COURSE  As part of my medical decision making, I reviewed the following data within the electronic MEDICAL RECORD NUMBER Notes from prior ED visits and  Controlled Substance Database  20 year old female presents to the ED with complaint of initial earache and was placed on amoxicillin which did not take care of her symptoms and then was prescribed doxycycline and yesterday prednisone which is not helping with her symptoms.  Patient complains of right-sided lymphadenopathy which was noticeable this morning.  Currently she is afebrile and continues to eat and drink without any difficulty.  Lab work was benign however CT soft tissue was positive for right sialadenitis and early cellulitis.  Patient was started on Augmentin and higher doses of prednisone.  She is strongly encouraged to get lemon drops or sour candies to keep in her mouth.  A prescription for hydrocodone was sent to the pharmacy.  She is encouraged to call make a follow-up appoint with Dr. [place] Landry who is the ENT on call today. ____________________________________________   FINAL CLINICAL IMPRESSION(S) / ED DIAGNOSES  Final diagnoses:  Sialadenitis     ED Discharge Orders          Ordered    amoxicillin-clavulanate (AUGMENTIN) 875-125 MG tablet  2 times daily        06/12/21 1451     predniSONE (DELTASONE) 10 MG tablet        06/12/21 1451    HYDROcodone-acetaminophen (NORCO/VICODIN) 5-325 MG tablet  Every 6 hours PRN        06/12/21 1451             Note:  This document was prepared using Dragon voice recognition software and may include unintentional dictation errors.    Johnn Hai, PA-C 06/12/21 1556    Lavonia Drafts, MD 06/13/21 [phone]

## 2021-06-12 NOTE — ED Triage Notes (Signed)
Pt states that she has been fighting a sinus infection for the past couple weeks- pt then got an ear infection from it and got amoxicillin for it- pt states the ear infection cleared but came back and then she was put on doxy- pt states the doxy has not helped much and then she got an ulcer in her mouth- pt states she was put on prednisone yesterday for that but noticed her lymph nodes were more swollen this AM

## 2021-06-14 DIAGNOSIS — H6983 Other specified disorders of Eustachian tube, bilateral: Secondary | ICD-10-CM | POA: Diagnosis not present

## 2021-06-14 DIAGNOSIS — K1121 Acute sialoadenitis: Secondary | ICD-10-CM | POA: Diagnosis not present

## 2021-07-01 DIAGNOSIS — Z419 Encounter for procedure for purposes other than remedying health state, unspecified: Secondary | ICD-10-CM | POA: Diagnosis not present

## 2021-08-01 DIAGNOSIS — Z419 Encounter for procedure for purposes other than remedying health state, unspecified: Secondary | ICD-10-CM | POA: Diagnosis not present

## 2021-08-29 IMAGING — US US PELVIS COMPLETE WITH TRANSVAGINAL
1 series · 13 of 25 positions shown · non-contrast
Comparison: None

CLINICAL DATA: Irregular menses, heavy painful menstrual periods,
LMP 06/02/2020; family history of polycystic ovarian syndrome

EXAM:
TRANSABDOMINAL AND TRANSVAGINAL ULTRASOUND OF PELVIS
TECHNIQUE: Both transabdominal and transvaginal ultrasound examinations of the
pelvis were performed. Transabdominal technique was performed for
global imaging of the pelvis including uterus, ovaries, adnexal
regions, and pelvic cul-de-sac. It was necessary to proceed with
endovaginal exam following the transabdominal exam to visualize the
endometrium and ovaries.

[Series 1: us pelvic complete with transvaginal · 13 of 81 slices shown]
[im 1/81]
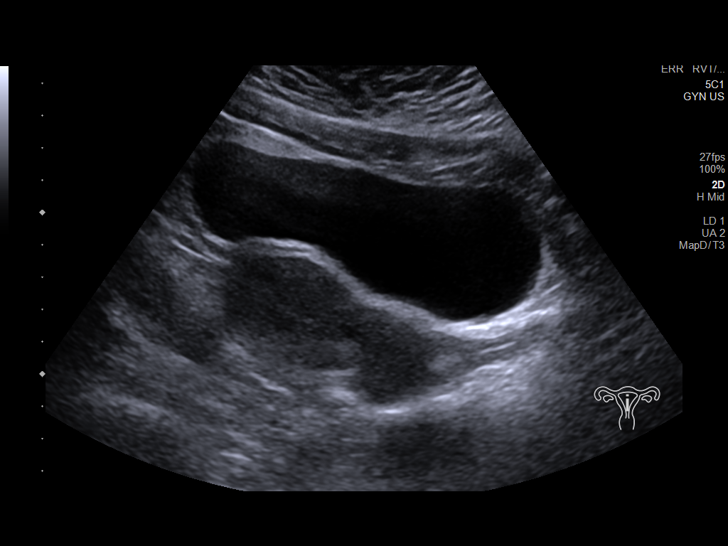
[im 7/81]
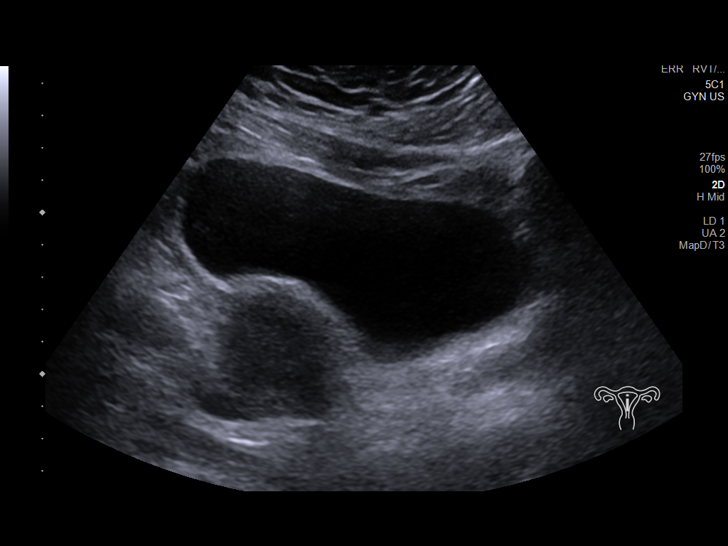
[im 14/81]
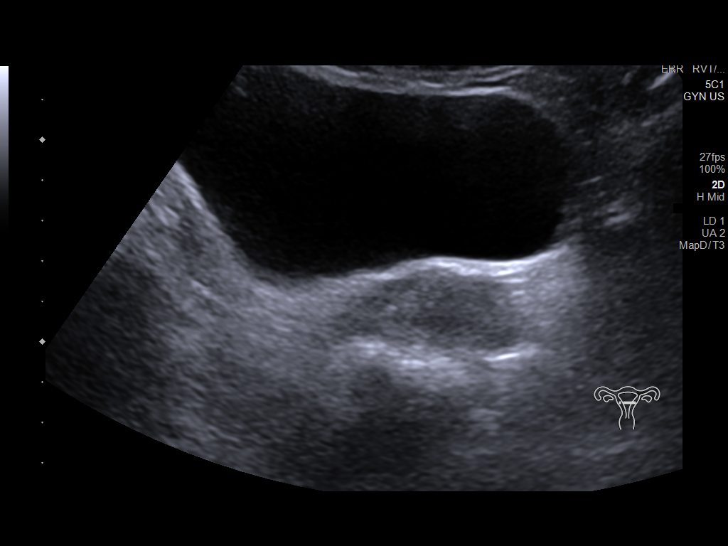
[im 21/81]
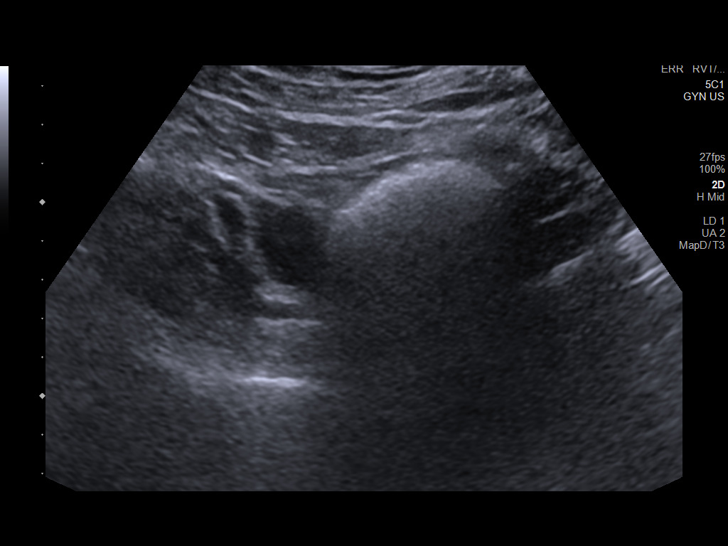
[im 27/81]
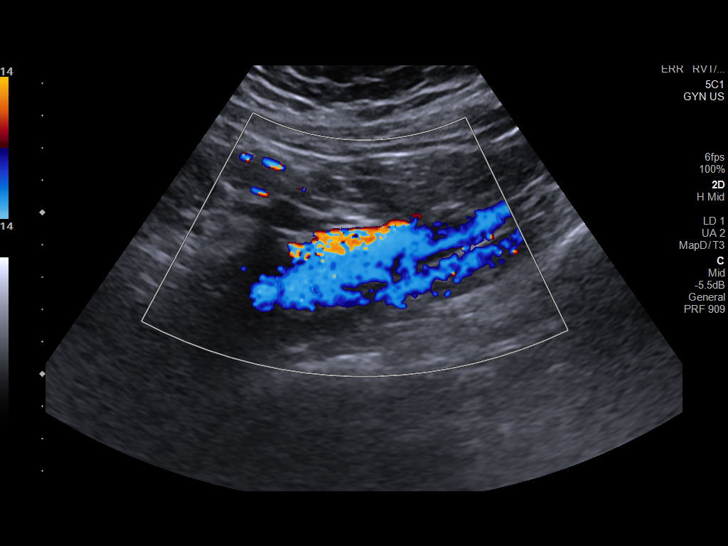
[im 34/81]
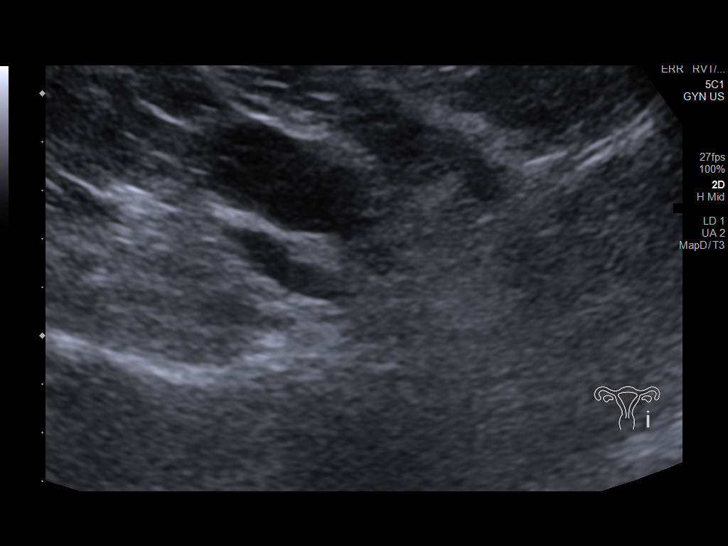
[im 41/81]
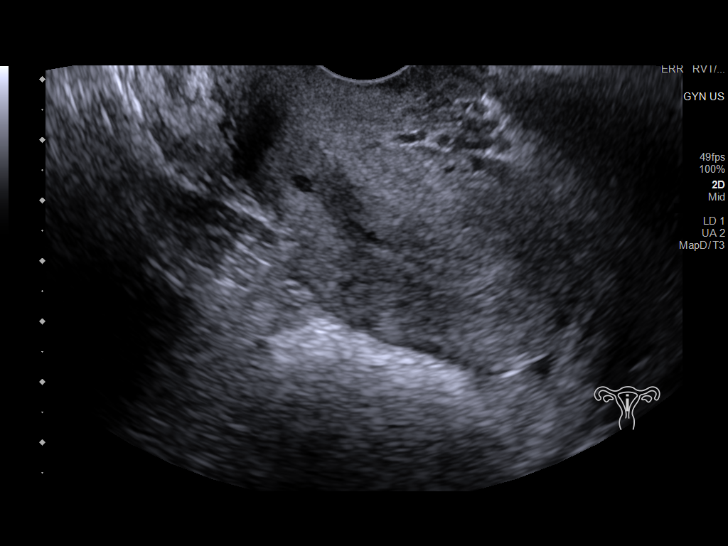
[im 47/81]
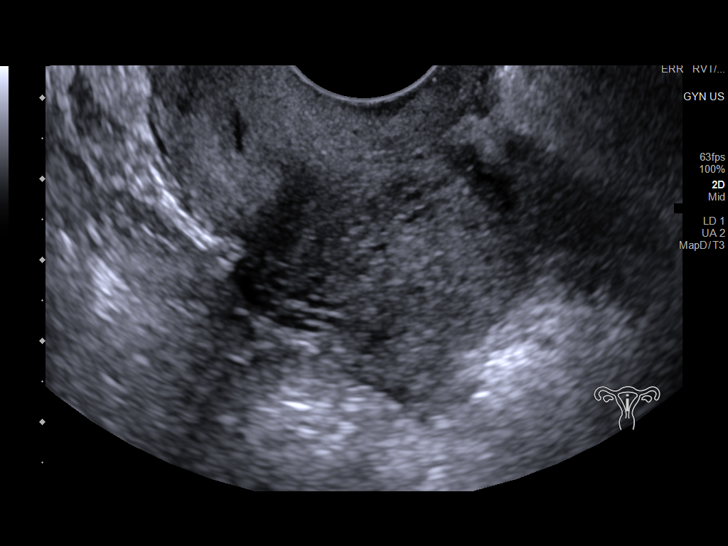
[im 54/81]
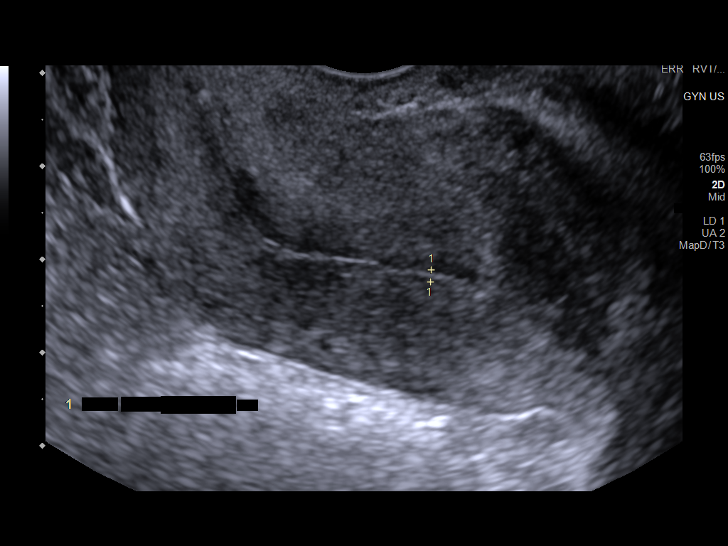
[im 61/81]
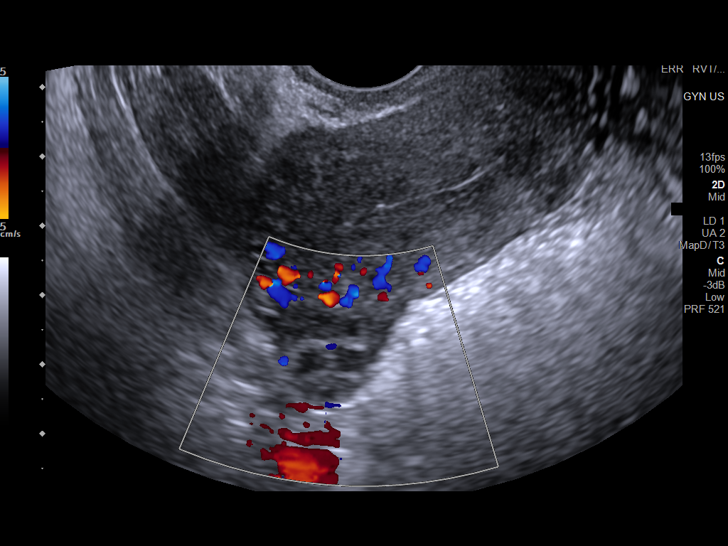
[im 67/81]
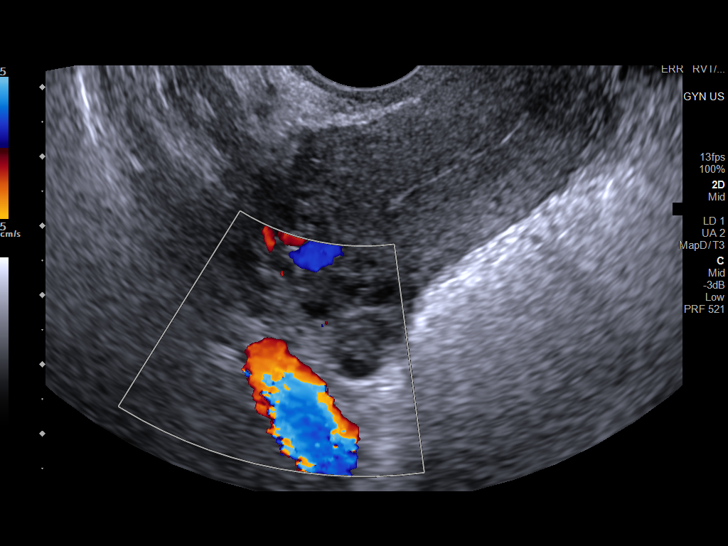
[im 74/81]
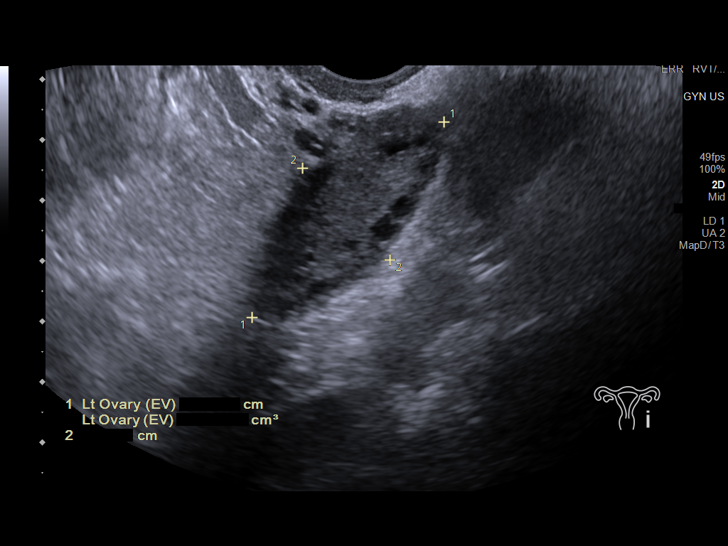
[im 81/81]
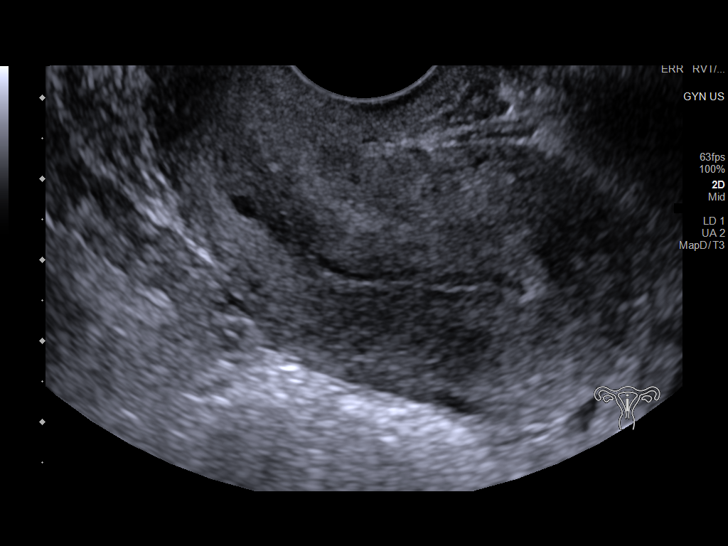

[13 of 25 positions shown; findings below may reference images not displayed]

FINDINGS: Uterus

Measurements: 7.7 x 3.4 x 5.3 cm = volume: 72 mL. Retroverted.
Heterogeneous myometrium. No focal mass.

Endometrium

Thickness: 1 mm.  No endometrial fluid or focal abnormality

Right ovary

Measurements: 3.1 x 2.0 x 1.6 cm = volume: 5.2 mL. Normal morphology
without mass

Left ovary

Measurements: 4.5 x 2.1 x 1.7 cm = volume: 18.0 mL. Numerous
peripheral follicles. No dominant mass.

Other findings

Trace free pelvic fluid.  No adnexal masses.
IMPRESSION: Unremarkable uterus and endometrial complex.

Numerous peripheral follicles throughout LEFT ovary, nonspecific but
can be seen in patients with polycystic ovarian syndrome; recommend
clinical and laboratory correlation.

## 2021-08-30 ENCOUNTER — Encounter: Payer: Self-pay | Admitting: Adult Health

## 2021-08-30 ENCOUNTER — Ambulatory Visit (INDEPENDENT_AMBULATORY_CARE_PROVIDER_SITE_OTHER): Payer: Medicaid Other | Admitting: Adult Health

## 2021-08-30 VITALS — Ht 66.0 in

## 2021-08-30 DIAGNOSIS — F419 Anxiety disorder, unspecified: Secondary | ICD-10-CM

## 2021-08-30 DIAGNOSIS — Z3041 Encounter for surveillance of contraceptive pills: Secondary | ICD-10-CM | POA: Diagnosis not present

## 2021-08-30 MED ORDER — BUSPIRONE HCL 5 MG PO TABS: 5.0000 mg | ORAL_TABLET | Freq: Three times a day (TID) | ORAL | 2 refills | Status: DC

## 2021-08-30 MED ORDER — LO LOESTRIN FE 1 MG-10 MCG / 10 MCG PO TABS: 1.0000 | ORAL_TABLET | Freq: Every day | ORAL | 11 refills | Status: DC

## 2021-08-30 NOTE — Progress Notes (Signed)
Patient ID: Susan Vazquez, female   DOB: 05/27/2001, 21 y.o.   MRN: [id number]   TELEHEALTH GYNECOLOGY VISIT ENCOUNTER NOTE  Provider location: [facility] [place] [facility]   Patient location: at work, in her car sitting still.   I connected with Susan Vazquez on 08/30/21 at  3:50 PM EST by telephone and verified that I am speaking with the correct person using two identifiers. Patient was unable to do MyChart audiovisual encounter due to technical difficulties, she tried several times.    I discussed the limitations, risks, security and privacy concerns of performing an evaluation and management service by telephone and the availability of in person appointments. I also discussed with the patient that there may be a patient responsible charge related to this service. The patient expressed understanding and agreed to proceed.   History:  AKIRA PERUSSE is a 21 y.o. G0P0000 female being evaluated today for birth control, is feeling moody on and off and overwhelmed, thinks it is pill related.. She denies any abnormal vaginal discharge, bleeding, pelvic pain or other concerns.       Past Medical History:  Diagnosis Date   Seasonal allergies    Past Surgical History:  Procedure Laterality Date   ADENOIDECTOMY     TONSILLECTOMY     TONSILLECTOMY     WISDOM TOOTH EXTRACTION     The following portions of the patient's history were reviewed and updated as appropriate: allergies, current medications, past family history, past medical history, past social history, past surgical history and problem list.   Health Maintenance:  will make appt for pap in about 8 weeks   Review of Systems:  Pertinent items noted in HPI and remainder of comprehensive ROS otherwise negative.  Physical Exam:   General:  Alert, oriented and cooperative.   Mental Status: Normal mood and affect perceived. Normal judgment and thought content.  Physical exam deferred due to nature of the  encounter Ht 5\' 6"  (1.676 m)    LMP 08/27/2021    BMI 46.81 kg/m   Fall risk is low Depression screen [facility] 2/9 08/30/2021 07/02/2020  Decreased Interest 1 0  Down, Depressed, Hopeless 0 0  PHQ - 2 Score 1 0  Altered sleeping - 0  Tired, decreased energy - 0  Change in appetite - 0  Feeling bad or failure about yourself  - 0  Trouble concentrating - 0  Moving slowly or fidgety/restless - 0  Suicidal thoughts - 0  PHQ-9 Score - 0    GAD 7 : Generalized Anxiety Score 08/30/2021 07/02/2020  Nervous, Anxious, on Edge 1 0  Control/stop worrying 1 0  Worry too much - different things 0 0  Trouble relaxing 1 0  Restless 0 0  Easily annoyed or irritable 3 0  Afraid - awful might happen 0 0  Total GAD 7 Score 6 0  Anxiety Difficulty Not difficult at all -    Upstream - 08/30/21 1601       Pregnancy Intention Screening   Does the patient want to become pregnant in the next year? No    Does the patient's partner want to become pregnant in the next year? No    Would the patient like to discuss contraceptive options today? Yes      Contraception Wrap Up   Current Method Oral Contraceptive    End Method Oral Contraceptive    Contraception Counseling Provided Yes  Labs and Imaging No results found for this or any previous visit (from the past 336 hour(s)). No results found.    Assessment and Plan:     1. Encounter for surveillance of contraceptive pills Will change OCs to lo Loestrin   Meds ordered this encounter  Medications   busPIRone (BUSPAR) 5 MG tablet    Sig: Take 1 tablet (5 mg total) by mouth 3 (three) times daily.    Dispense:  90 tablet    Refill:  2    Order Specific Question:   Supervising Provider    Answer:   Elonda Husky, LUTHER H [[id number]]   Norethindrone-Ethinyl Estradiol-Fe Biphas (LO LOESTRIN FE) 1 MG-10 MCG / 10 MCG tablet    Sig: Take 1 tablet by mouth daily. Take 1 daily by mouth    Dispense:  28 tablet    Refill:  11    BIN [id number], PCN [id number], GRP  [id number]    Order Specific Question:   Supervising Provider    Answer:   Tania Ade H [[id number]]     2. Anxiety Will rx Buspar  Return in 8 weeks for pap and physical       I discussed the assessment and treatment plan with the patient. The patient was provided an opportunity to ask questions and all were answered. The patient agreed with the plan and demonstrated an understanding of the instructions.   The patient was advised to call back or seek an in-person evaluation/go to the ED if the symptoms worsen or if the condition fails to improve as anticipated.  I provided 10 minutes of non-face-to-face time during this encounter. I was in my office at [facility] during the encounter.   Derrek Monaco, NP [facility] [employer], [place]

## 2021-09-01 DIAGNOSIS — Z419 Encounter for procedure for purposes other than remedying health state, unspecified: Secondary | ICD-10-CM | POA: Diagnosis not present

## 2021-09-24 ENCOUNTER — Ambulatory Visit (INDEPENDENT_AMBULATORY_CARE_PROVIDER_SITE_OTHER): Payer: Medicaid Other | Admitting: Family

## 2021-09-24 ENCOUNTER — Encounter: Payer: Self-pay | Admitting: Family

## 2021-09-24 ENCOUNTER — Other Ambulatory Visit: Payer: Self-pay

## 2021-09-24 VITALS — BP 118/72 | HR 78 | Ht 66.0 in | Wt 300.0 lb

## 2021-09-24 DIAGNOSIS — R635 Abnormal weight gain: Secondary | ICD-10-CM | POA: Diagnosis not present

## 2021-09-24 DIAGNOSIS — Z6841 Body Mass Index (BMI) 40.0 and over, adult: Secondary | ICD-10-CM | POA: Diagnosis not present

## 2021-09-24 DIAGNOSIS — F419 Anxiety disorder, unspecified: Secondary | ICD-10-CM

## 2021-09-24 DIAGNOSIS — Z3041 Encounter for surveillance of contraceptive pills: Secondary | ICD-10-CM

## 2021-09-24 DIAGNOSIS — Z1283 Encounter for screening for malignant neoplasm of skin: Secondary | ICD-10-CM | POA: Insufficient documentation

## 2021-09-24 DIAGNOSIS — E282 Polycystic ovarian syndrome: Secondary | ICD-10-CM | POA: Diagnosis not present

## 2021-09-24 LAB — TSH: TSH: 1.31 u[IU]/mL (ref 0.35–5.50)

## 2021-09-24 LAB — HEMOGLOBIN A1C: Hgb A1c MFr Bld: 5.5 % (ref 4.6–6.5)

## 2021-09-24 MED ORDER — BUSPIRONE HCL 5 MG PO TABS: 5.0000 mg | ORAL_TABLET | Freq: Every day | ORAL | 2 refills | Status: AC

## 2021-09-24 NOTE — Progress Notes (Signed)
New Patient Office Visit  Subjective:  Patient ID: Susan Vazquez, female    DOB: May 15, 2001  Age: 21 y.o. MRN: [id number]  CC:  Chief Complaint  Patient presents with   New Patient (Initial Visit)    Pt stated wanted to get establish    HPI Susan Vazquez is here to establish care as a new patient.  Prior provider PTW:SFKCLEX Creig Hines, MD  Pt is with acute concerns.   Obesity: weight concerns, hard to lose weight, frustrating. Doesn't really exercise. Did go to the gym last year. With PCOS makes it more difficult.   chronic concerns:  OCPs: first pap smear scheduled 3/23 as she is now 20.   GAD: buspar ,prescribed to her to take three times daily but pt states that she takes only as needed. Hasn't taken it for the last two days, sometimes takes two a day, then none at all. Anxiety manageable stress ok.   Irregular periods: confirmed with trasvaginal u/s and cysts on left ovary. GYN now dx her with PCOS. Now regular monthly periods since starting on birth control.   Past Medical History:  Diagnosis Date   Irregular periods 07/02/2020   Screening for diabetes mellitus 07/02/2020   Seasonal allergies    Weight gain 07/02/2020    Past Surgical History:  Procedure Laterality Date   ADENOIDECTOMY Bilateral 2007   TONSILLECTOMY Bilateral    2007   WISDOM TOOTH EXTRACTION      Family History  Problem Relation Age of Onset   Diabetes Mother    Cancer Father        stage 3 melonoma cancer   Dementia Maternal Grandmother    Suicidality Maternal Grandfather     Social History   Socioeconomic History   Marital status: Significant Other    Spouse name: Not on file   Number of children: 0   Years of education: Not on file   Highest education level: Not on file  Occupational History   Occupation: [employer]  Tobacco Use   Smoking status: Never   Smokeless tobacco: Never  Vaping Use   Vaping Use: Former  Substance and Sexual Activity   Alcohol use: Yes    Comment:  socially   Drug use: Never   Sexual activity: Yes    Partners: Male    Birth control/protection: Pill  Other Topics Concern   Not on file  Social History Narrative   Not on file   Social Determinants of Health   Financial Resource Strain: Not on file  Food Insecurity: Not on file  Transportation Needs: Not on file  Physical Activity: Not on file  Stress: Not on file  Social Connections: Not on file  Intimate Partner Violence: Not on file    Outpatient Medications Prior to Visit  Medication Sig Dispense Refill   fluticasone (FLONASE) 50 MCG/ACT nasal spray Place 2 sprays into the nose daily.       Loratadine (CLARITIN PO) Take by mouth.     Norethindrone-Ethinyl Estradiol-Fe Biphas (LO LOESTRIN FE) 1 MG-10 MCG / 10 MCG tablet Take 1 tablet by mouth daily. Take 1 daily by mouth 28 tablet 11   busPIRone (BUSPAR) 5 MG tablet Take 1 tablet (5 mg total) by mouth 3 (three) times daily. 90 tablet 2   No facility-administered medications prior to visit.    Allergies  Allergen Reactions   Sulfa Antibiotics Rash    ROS Review of Systems  Review of Systems  Weight gain, hard to  lose weight.  Respiratory:  Negative for shortness of breath.   Cardiovascular:  Negative for chest pain and palpitations.  Gastrointestinal:  Negative for constipation and diarrhea.  Genitourinary:  Negative for dysuria, frequency and urgency.  Musculoskeletal:  Negative for myalgias.  Psychiatric/Behavioral:  Negative for depression and suicidal ideas. Some anxiety here and there.   All other systems reviewed and are negative.    Objective:    Physical Exam  Gen: NAD, resting comfortably CV: RRR with no murmurs appreciated Pulm: NWOB, CTAB with no crackles, wheezes, or rhonchi Skin: warm, dry Psych: Normal affect and thought content  BP 118/72    Pulse 78    Ht 5\' 6"  (1.676 m)    Wt 300 lb (136.1 kg)    LMP 08/27/2021    SpO2 98%    BMI 48.42 kg/m  Wt Readings from Last 3 Encounters:   09/24/21 300 lb (136.1 kg)  06/12/21 290 lb (131.5 kg)  07/02/20 293 lb 8 oz (133.1 kg)     Health Maintenance Due  Topic Date Due   COVID-19 Vaccine (1) Never done   HPV VACCINES (1 - 2-dose series) Never done   HIV Screening  Never done   Hepatitis C Screening  Never done   TETANUS/TDAP  Never done   PAP-Cervical Cytology Screening  Never done   PAP SMEAR-Modifier  Never done   INFLUENZA VACCINE  03/01/2021       Topic Date Due   HPV VACCINES (1 - 2-dose series) Never done    Lab Results  Component Value Date   TSH 1.330 07/02/2020   Lab Results  Component Value Date   WBC 9.3 06/12/2021   HGB 13.7 06/12/2021   HCT 41.4 06/12/2021   MCV 86.3 06/12/2021   PLT 330 06/12/2021   Lab Results  Component Value Date   NA 139 06/12/2021   K 3.5 06/12/2021   CO2 27 06/12/2021   GLUCOSE 95 06/12/2021   BUN 10 06/12/2021   CREATININE 0.61 06/12/2021   BILITOT 0.3 07/02/2020   ALKPHOS 64 07/02/2020   AST 14 07/02/2020   ALT 15 07/02/2020   PROT 7.2 07/02/2020   ALBUMIN 4.4 07/02/2020   CALCIUM 9.5 06/12/2021   ANIONGAP 10 06/12/2021   No results found for: CHOL No results found for: HDL No results found for: LDLCALC No results found for: TRIG No results found for: CHOLHDL Lab Results  Component Value Date   HGBA1C 5.4 07/02/2020      Assessment & Plan:   Problem List Items Addressed This Visit       Endocrine   PCOS (polycystic ovarian syndrome)    Continue ocps, follow up with gyn as scheduled.  Pap smear for next month as scheduled.  Will check a1c for insulin resistance. Diet and exercise discussed with patient.  D/w pt metformin pt declines for now      Relevant Orders   Hemoglobin A1c     Other   Morbid obesity with BMI of 45.0-49.9, adult ([place])    D/w pt diet and exercise.       Anxiety    D/w pt in regards to buspar, that she needs to take daily not prn otherwise may have rebound symptoms.        Relevant Medications   busPIRone  (BUSPAR) 5 MG tablet   Encounter for surveillance of contraceptive pills    Cont f/u with ocps and f/u with gyn as scheduled.  Screening for malignant neoplasm of skin - Primary    Dad with h/o melonoma.  D/w pt spf on daily, no tanning beds etc.  Ref derm for annual skin screening      Relevant Orders   Ambulatory referral to Dermatology   Other Visit Diagnoses     Weight gain       Relevant Orders   TSH   Hemoglobin A1c       Meds ordered this encounter  Medications   busPIRone (BUSPAR) 5 MG tablet    Sig: Take 1 tablet (5 mg total) by mouth daily.    Dispense:  30 tablet    Refill:  2    Order Specific Question:   Supervising Provider    Answer:   Diona Browner, AMY E [[id number]]    Follow-up: Return in about 6 months (around 03/24/2022) for for regular follow up on medication.    Eugenia Pancoast, FNP

## 2021-09-24 NOTE — Assessment & Plan Note (Signed)
D/w pt diet and exercise  

## 2021-09-24 NOTE — Assessment & Plan Note (Signed)
Dad with h/o melonoma.  D/w pt spf on daily, no tanning beds etc.  Ref derm for annual skin screening

## 2021-09-24 NOTE — Patient Instructions (Signed)
Stop by the lab prior to leaving today. I will notify you of your results once received.   It was a pleasure seeing you today! Please do not hesitate to reach out with any questions and or concerns.  Regards,   Eugenia Pancoast FNP-C

## 2021-09-24 NOTE — Assessment & Plan Note (Signed)
D/w pt in regards to buspar, that she needs to take daily not prn otherwise may have rebound symptoms.

## 2021-09-24 NOTE — Assessment & Plan Note (Addendum)
Continue ocps, follow up with gyn as scheduled.  Pap smear for next month as scheduled.  Will check a1c for insulin resistance. Diet and exercise discussed with patient.  D/w pt metformin pt declines for now

## 2021-09-24 NOTE — Assessment & Plan Note (Signed)
Cont f/u with ocps and f/u with gyn as scheduled.

## 2021-09-29 DIAGNOSIS — Z419 Encounter for procedure for purposes other than remedying health state, unspecified: Secondary | ICD-10-CM | POA: Diagnosis not present

## 2021-10-25 ENCOUNTER — Ambulatory Visit (INDEPENDENT_AMBULATORY_CARE_PROVIDER_SITE_OTHER): Payer: Medicaid Other | Admitting: Adult Health

## 2021-10-25 ENCOUNTER — Other Ambulatory Visit: Payer: Self-pay

## 2021-10-25 ENCOUNTER — Encounter: Payer: Self-pay | Admitting: Adult Health

## 2021-10-25 ENCOUNTER — Other Ambulatory Visit (HOSPITAL_COMMUNITY)
Admission: RE | Admit: 2021-10-25 | Discharge: 2021-10-25 | Disposition: A | Discharge status: Home / Self Care | Payer: Medicaid Other | Source: Ambulatory Visit | Attending: Adult Health | Admitting: Adult Health

## 2021-10-25 VITALS — BP 135/88 | HR 88 | Ht 66.0 in | Wt 298.4 lb

## 2021-10-25 DIAGNOSIS — Z01419 Encounter for gynecological examination (general) (routine) without abnormal findings: Secondary | ICD-10-CM | POA: Insufficient documentation

## 2021-10-25 NOTE — Progress Notes (Signed)
Patient ID: Susan Vazquez, female   DOB: July 02, 2001, 21 y.o.   MRN: [id number] ?History of Present Illness: ?Susan Vazquez is a 21 year old white female, with DP, G0P0, in for a well woman gyn exam and first pap she recently established care with T. Dugal FNP and she refilled her Buspar. ?PCP is T. Phebe Colla FNP. ? ?Current Medications, Allergies, Past Medical History, Past Surgical History, Family History and Social History were reviewed in [employer] record.   ? ? ?Review of Systems: ? ?Patient denies any headaches, hearing loss, fatigue, blurred vision, shortness of breath, chest pain, abdominal pain, problems with bowel movements, urination, or intercourse. No joint pain or mood swings.  ?Had lite period with lo Loestrin ? ?Physical Exam:BP 135/88 (BP Location: Right Arm, Patient Position: Sitting, Cuff Size: Normal)   Pulse 88   Ht '5\' 6"'$  (1.676 m)   Wt 298 lb 6.4 oz (135.4 kg)   LMP 10/23/2021 (Exact Date)   BMI 48.16 kg/m?   ?General:  Well developed, well nourished, no acute distress ?Skin:  Warm and dry ?Neck:  Midline trachea, normal thyroid, good ROM, no lymphadenopathy ?Lungs; Clear to auscultation bilaterally ?Breast:  No dominant palpable mass, retraction, or nipple discharge ?Cardiovascular: Regular rate and rhythm ?Abdomen:  Soft, non tender, no hepatosplenomegaly ?Pelvic:  External genitalia is normal in appearance, no lesions.  The vagina is normal in appearance. Urethra has no lesions or masses. The cervix is nulliparous, pap with GC/CHL performed.  Uterus is felt to be normal size, shape, and contour.  No adnexal masses or tenderness noted.Bladder is non tender, no masses felt. ?Rectal: Deferred  ?Extremities/musculoskeletal:  No swelling or varicosities noted, no clubbing or cyanosis ?Psych:  No mood changes, alert and cooperative,seems happy ?AA is 1 ?Fall risk is low ? ?  10/25/2021  ? 11:05 AM 09/24/2021  ? 12:37 PM 08/30/2021  ?  4:07 PM  ?Depression screen PHQ 2/9  ?Decreased  Interest 0 1 1  ?Down, Depressed, Hopeless 0 0 0  ?PHQ - 2 Score 0 1 1  ?Altered sleeping 0 1   ?Tired, decreased energy 0 1   ?Change in appetite 0 0   ?Feeling bad or failure about yourself  0 0   ?Trouble concentrating 0 0   ?Moving slowly or fidgety/restless 0 0   ?Suicidal thoughts 0 0   ?PHQ-9 Score 0 3   ?Difficult doing work/chores  Not difficult at all   ?  ? ?  10/25/2021  ? 11:05 AM 09/24/2021  ? 12:38 PM 08/30/2021  ?  4:08 PM 07/02/2020  ?  9:40 AM  ?GAD 7 : Generalized Anxiety Score  ?Nervous, Anxious, on Edge 0 1 1 0  ?Control/stop worrying 0 0 1 0  ?Worry too much - different things 0 0 0 0  ?Trouble relaxing 1 0 1 0  ?Restless 0  0 0  ?Easily annoyed or irritable '1 1 3 '$ 0  ?Afraid - awful might happen 0 0 0 0  ?Total GAD 7 Score 2  6 0  ?Anxiety Difficulty  Not difficult at all Not difficult at all   ?On Buspar ?  ? [place] - 10/25/21 1104   ? ?  ? Pregnancy Intention Screening  ? Does the patient want to become pregnant in the next year? No   ? Does the patient's partner want to become pregnant in the next year? No   ? Would the patient like to discuss contraceptive options today? No   ?  ?  Contraception Wrap Up  ? Current Method Oral Contraceptive   ? End Method Oral Contraceptive   ? Contraception Counseling Provided No   ? ?  ?  ? ?  ? Examination chaperoned by Diona Fanti CMA ? ?Impression and Plan ?1. Encounter for gynecological examination with Papanicolaou smear of cervix ?Pap sent ?Physical in 1 year ?Pap in 3 years if normal ?Labs with PCP ?Continue lo Loestrin, has refills  ? ? ? ?  ?  ?

## 2021-10-26 ENCOUNTER — Ambulatory Visit
Admission: RE | Admit: 2021-10-26 | Discharge: 2021-10-26 | Disposition: A | Discharge status: Home / Self Care | Payer: Medicaid Other | Source: Ambulatory Visit | Attending: Physician Assistant | Admitting: Physician Assistant

## 2021-10-26 ENCOUNTER — Other Ambulatory Visit: Payer: Self-pay

## 2021-10-26 VITALS — BP 112/79 | HR 115 | Temp 98.4°F | Resp 18

## 2021-10-26 DIAGNOSIS — J020 Streptococcal pharyngitis: Secondary | ICD-10-CM | POA: Diagnosis not present

## 2021-10-26 LAB — POCT RAPID STREP A (OFFICE): Rapid Strep A Screen: POSITIVE — AB

## 2021-10-26 MED ORDER — AMOXICILLIN 500 MG PO CAPS: 500.0000 mg | ORAL_CAPSULE | Freq: Three times a day (TID) | ORAL | 0 refills | Status: DC

## 2021-10-26 NOTE — ED Provider Notes (Signed)
?[place] ? ? ? ?CSN: [id number] ?Arrival date & time: 10/26/21  1148 ? ? ?  ? ?History   ?Chief Complaint ?Chief Complaint  ?Patient presents with  ? Sore Throat  ?  Entered by patient  ? ? ?HPI ?Susan Vazquez is a 21 y.o. female.  ? ?Patient here today for evaluation of sore throat that started yesterday morning.  She notes she has also had some ear pain and fever.  She has been taking Tylenol without significant relief.  She denies any vomiting or diarrhea. ? ?The history is provided by the patient.  ?Sore Throat ?Pertinent negatives include no abdominal pain and no shortness of breath.  ? ?Past Medical History:  ?Diagnosis Date  ? Irregular periods 07/02/2020  ? Screening for diabetes mellitus 07/02/2020  ? Seasonal allergies   ? Weight gain 07/02/2020  ? ? ?Patient Active Problem List  ? Diagnosis Date Noted  ? Screening for malignant neoplasm of skin 09/24/2021  ? PCOS (polycystic ovarian syndrome) 09/24/2021  ? Anxiety 08/30/2021  ? Encounter for surveillance of contraceptive pills 08/30/2021  ? Weight gain 07/02/2020  ? Morbid obesity with BMI of 45.0-49.9, adult ([place]) 07/02/2020  ? ? ?Past Surgical History:  ?Procedure Laterality Date  ? ADENOIDECTOMY Bilateral 2007  ? TONSILLECTOMY Bilateral   ? 2007  ? WISDOM TOOTH EXTRACTION    ? ? ?OB History   ? ? Gravida  ?0  ? Para  ?0  ? Term  ?0  ? Preterm  ?0  ? AB  ?0  ? Living  ?0  ?  ? ? SAB  ?0  ? IAB  ?0  ? Ectopic  ?0  ? Multiple  ?0  ? Live Births  ?0  ?   ?  ?  ? ? ? ?Home Medications   ? ?Prior to Admission medications   ?Medication Sig Start Date End Date Taking? Authorizing Provider  ?amoxicillin (AMOXIL) 500 MG capsule Take 1 capsule (500 mg total) by mouth 3 (three) times daily. 10/26/21  Yes Francene Finders, PA-C  ?fluticasone (FLONASE) 50 MCG/ACT nasal spray Place 2 sprays into the nose daily.      [provider]  ?Loratadine (CLARITIN PO) Take by mouth.    [provider]  ?Norethindrone-Ethinyl Estradiol-Fe Biphas  (LO LOESTRIN FE) 1 MG-10 MCG / 10 MCG tablet Take 1 tablet by mouth daily. Take 1 daily by mouth 08/30/21   Estill Dooms, NP  ?cetirizine (ZYRTEC) 10 MG tablet Take 10 mg by mouth daily.    03/04/20  [provider]  ? ? ?Family History ?Family History  ?Problem Relation Age of Onset  ? Diabetes Mother   ? Cancer Father   ?     stage 3 melonoma cancer  ? Dementia Maternal Grandmother   ? Suicidality Maternal Grandfather   ? ? ?Social History ?Social History  ? ?Tobacco Use  ? Smoking status: Never  ? Smokeless tobacco: Never  ?Vaping Use  ? Vaping Use: Former  ?Substance Use Topics  ? Alcohol use: Yes  ?  Comment: socially  ? Drug use: Never  ? ? ? ?Allergies   ?Sulfa antibiotics ? ? ?Review of Systems ?Review of Systems  ?Constitutional:  Positive for fever. Negative for chills.  ?HENT:  Positive for ear pain and sore throat. Negative for congestion.   ?Eyes:  Negative for discharge and redness.  ?Respiratory:  Negative for cough and shortness of breath.   ?Gastrointestinal:  Negative  for abdominal pain, diarrhea, nausea and vomiting.  ? ? ?Physical Exam ?Triage Vital Signs ?ED Triage Vitals  ?Enc Vitals Group  ?   BP 10/26/21 1202 112/79  ?   Pulse Rate 10/26/21 1202 (!) 115  ?   Resp 10/26/21 1202 18  ?   Temp 10/26/21 1202 98.4 ?F (36.9 ?C)  ?   Temp Source 10/26/21 1202 Oral  ?   SpO2 10/26/21 1202 97 %  ?   Weight --   ?   Height --   ?   Head Circumference --   ?   Peak Flow --   ?   Pain Score 10/26/21 1204 6  ?   Pain Loc --   ?   Pain Edu? --   ?   Excl. in [place]? --   ? ?No data found. ? ?Updated Vital Signs ?BP 112/79 (BP Location: Left Arm)   Pulse (!) 115   Temp 98.4 ?F (36.9 ?C) (Oral)   Resp 18   LMP 10/23/2021 (Exact Date)   SpO2 97%  ?   ? ?Physical Exam ?Vitals and nursing note reviewed.  ?Constitutional:   ?   General: She is not in acute distress. ?   Appearance: Normal appearance. She is not ill-appearing.  ?HENT:  ?   Head: Normocephalic and atraumatic.  ?   Nose: Nose normal.  No congestion or rhinorrhea.  ?   Mouth/Throat:  ?   Mouth: Mucous membranes are moist.  ?   Pharynx: Posterior oropharyngeal erythema present. No oropharyngeal exudate.  ?Eyes:  ?   Conjunctiva/sclera: Conjunctivae normal.  ?Cardiovascular:  ?   Rate and Rhythm: Normal rate.  ?Pulmonary:  ?   Effort: Pulmonary effort is normal.  ?Neurological:  ?   Mental Status: She is alert.  ?Psychiatric:     ?   Mood and Affect: Mood normal.     ?   Behavior: Behavior normal.     ?   Thought Content: Thought content normal.  ? ? ? ?UC Treatments / Results  ?Labs ?(all labs ordered are listed, but only abnormal results are displayed) ?Labs Reviewed  ?POCT RAPID STREP A (OFFICE) - Abnormal; Notable for the following components:  ?    Result Value  ? Rapid Strep A Screen Positive (*)   ? All other components within normal limits  ? ? ?EKG ? ? ?Radiology ?No results found. ? ?Procedures ?Procedures (including critical care time) ? ?Medications Ordered in UC ?Medications - No data to display ? ?Initial Impression / Assessment and Plan / UC Course  ?I have reviewed the triage vital signs and the nursing notes. ? ?Pertinent labs & imaging results that were available during my care of the patient were reviewed by me and considered in my medical decision making (see chart for details). ? ?  ?Strep test positive in office. Amoxicillin prescribed. Encouraged follow up with any further concerns or persistent symptoms.  ? ?Final Clinical Impressions(s) / UC Diagnoses  ? ?Final diagnoses:  ?Streptococcal sore throat  ? ?Discharge Instructions   ?None ?  ? ?ED Prescriptions   ? ? Medication Sig Dispense Auth. Provider  ? amoxicillin (AMOXIL) 500 MG capsule Take 1 capsule (500 mg total) by mouth 3 (three) times daily. 21 capsule Francene Finders, PA-C  ? ?  ? ?PDMP not reviewed this encounter. ?  ?Francene Finders, PA-C ?10/26/21 1221 ? ?

## 2021-10-26 NOTE — ED Triage Notes (Signed)
Onset yesterday morning of sore and onset last night bilateral ear pain and fever. Has been taking tylenol w/o relief. No v/d.  ?

## 2021-10-28 LAB — CYTOLOGY - PAP
Adequacy: ABSENT
Chlamydia: NEGATIVE
Comment: NEGATIVE
Comment: NEGATIVE
Comment: NORMAL
Diagnosis: UNDETERMINED — AB
High risk HPV: POSITIVE — AB
Neisseria Gonorrhea: NEGATIVE

## 2021-10-29 ENCOUNTER — Encounter: Payer: Self-pay | Admitting: Adult Health

## 2021-10-29 ENCOUNTER — Telehealth: Payer: Self-pay | Admitting: Adult Health

## 2021-10-29 DIAGNOSIS — R87810 Cervical high risk human papillomavirus (HPV) DNA test positive: Secondary | ICD-10-CM | POA: Insufficient documentation

## 2021-10-29 DIAGNOSIS — R87610 Atypical squamous cells of undetermined significance on cytologic smear of cervix (ASC-US): Secondary | ICD-10-CM

## 2021-10-29 HISTORY — DX: Atypical squamous cells of undetermined significance on cytologic smear of cervix (ASC-US): R87.810

## 2021-10-29 HISTORY — DX: Atypical squamous cells of undetermined significance on cytologic smear of cervix (ASC-US): R87.610

## 2021-10-29 NOTE — Telephone Encounter (Signed)
Pt aware of pap and repeat in 1 year ?

## 2021-10-30 DIAGNOSIS — Z419 Encounter for procedure for purposes other than remedying health state, unspecified: Secondary | ICD-10-CM | POA: Diagnosis not present

## 2021-11-04 ENCOUNTER — Ambulatory Visit (INDEPENDENT_AMBULATORY_CARE_PROVIDER_SITE_OTHER): Payer: Medicaid Other | Admitting: Family

## 2021-11-04 VITALS — BP 130/82 | HR 97 | Temp 98.3°F | Resp 16 | Ht 66.0 in | Wt 299.2 lb

## 2021-11-04 DIAGNOSIS — J0110 Acute frontal sinusitis, unspecified: Secondary | ICD-10-CM | POA: Diagnosis not present

## 2021-11-04 DIAGNOSIS — J301 Allergic rhinitis due to pollen: Secondary | ICD-10-CM | POA: Diagnosis not present

## 2021-11-04 MED ORDER — LEVOCETIRIZINE DIHYDROCHLORIDE 5 MG PO TABS: 5.0000 mg | ORAL_TABLET | Freq: Every evening | ORAL | 1 refills | Status: DC

## 2021-11-04 MED ORDER — PREDNISONE 20 MG PO TABS: ORAL_TABLET | ORAL | 0 refills | Status: DC

## 2021-11-04 MED ORDER — CEFDINIR 300 MG PO CAPS: 300.0000 mg | ORAL_CAPSULE | Freq: Two times a day (BID) | ORAL | 0 refills | Status: DC

## 2021-11-04 NOTE — Patient Instructions (Addendum)
Switch to zyrtec or xyzal RX sent to xyzal. ?Continue flonase.  ? ?Antibiotic sent to preferred pharmacy.  ? ?Please increase oral fluids, steamy hot shower/humidifier prn. ? ?Please follow up if no improvement in 2-3 days. ? ?It was a pleasure seeing you today! Please do not hesitate to reach out with any questions and or concerns. ? ?Regards,  ? ?Estevon Fluke ?FNP-C ? ? ? ? ?

## 2021-11-04 NOTE — Progress Notes (Signed)
? ?Established Patient Office Visit ? ?Subjective:  ?Patient ID: Susan Vazquez, female    DOB: Dec 11, 2000  Age: 21 y.o. MRN: [id number] ? ?CC:  ?Chief Complaint  ?Patient presents with  ? Hoarse  ? Sinus Problem  ?  X 4 days  ? Ear Fullness  ?  Right ear  ? ? ?HPI ?Susan Vazquez is here today with concerns.  ? ?Last week was found to be strep positive, was given amox tid x 7 days.  ?Finished her antbx this am.    ? ?Four days ago started to feel like right under water with increased drainage.  ?Two days ago voice was very hoarse but has since improved.  ?With sinus pressure and pain around the ear and nose and nasal cavity.  ? ?No fever no chills.  ?Cough , slight, dry non productive.  ?Taking aleve D with only mild relief.  ?Takes claritin daily.  ?Daily flonase.  ? ?FYI: on buspar 5 mg once daily. Was taking three times daily and doing well with it. Happy with dosage, does not need refill at current.  ? ?Past Medical History:  ?Diagnosis Date  ? ASCUS with positive high risk HPV cervical 10/29/2021  ? 10/29/21 repeat in 1 year per ASCCP guidelines   ? Irregular periods 07/02/2020  ? Screening for diabetes mellitus 07/02/2020  ? Seasonal allergies   ? Weight gain 07/02/2020  ? ? ?Past Surgical History:  ?Procedure Laterality Date  ? ADENOIDECTOMY Bilateral 2007  ? TONSILLECTOMY Bilateral   ? 2007  ? WISDOM TOOTH EXTRACTION    ? ? ?Family History  ?Problem Relation Age of Onset  ? Diabetes Mother   ? Cancer Father   ?     stage 3 melonoma cancer  ? Dementia Maternal Grandmother   ? Suicidality Maternal Grandfather   ? ? ?Social History  ? ?Socioeconomic History  ? Marital status: Significant Other  ?  Spouse name: Not on file  ? Number of children: 0  ? Years of education: Not on file  ? Highest education level: Not on file  ?Occupational History  ? Occupation: [employer]  ?Tobacco Use  ? Smoking status: Never  ? Smokeless tobacco: Never  ?Vaping Use  ? Vaping Use: Former  ?Substance and Sexual Activity  ? Alcohol use:  Yes  ?  Comment: socially  ? Drug use: Never  ? Sexual activity: Yes  ?  Partners: Male  ?  Birth control/protection: Pill  ?Other Topics Concern  ? Not on file  ?Social History Narrative  ? Not on file  ? ?Social Determinants of Health  ? ?Financial Resource Strain: Low Risk   ? Difficulty of Paying Living Expenses: Not hard at all  ?Food Insecurity: No Food Insecurity  ? Worried About Charity fundraiser in the Last Year: Never true  ? Ran Out of Food in the Last Year: Never true  ?Transportation Needs: No Transportation Needs  ? Lack of Transportation (Medical): No  ? Lack of Transportation (Non-Medical): No  ?Physical Activity: Insufficiently Active  ? Days of Exercise per Week: 3 days  ? Minutes of Exercise per Session: 20 min  ?Stress: No Stress Concern Present  ? Feeling of Stress : Only a little  ?Social Connections: Moderately Integrated  ? Frequency of Communication with Friends and Family: More than three times a week  ? Frequency of Social Gatherings with Friends and Family: Once a week  ? Attends Religious Services: 1 to 4 times  per year  ? Active Member of Clubs or Organizations: No  ? Attends Archivist Meetings: Never  ? Marital Status: Living with partner  ?Intimate Partner Violence: Not At Risk  ? Fear of Current or Ex-Partner: No  ? Emotionally Abused: No  ? Physically Abused: No  ? Sexually Abused: No  ? ? ?Outpatient Medications Prior to Visit  ?Medication Sig Dispense Refill  ? busPIRone (BUSPAR) 5 MG tablet Take 5 mg by mouth daily.    ? fluticasone (FLONASE) 50 MCG/ACT nasal spray Place 2 sprays into the nose daily.      ? Norethindrone-Ethinyl Estradiol-Fe Biphas (LO LOESTRIN FE) 1 MG-10 MCG / 10 MCG tablet Take 1 tablet by mouth daily. Take 1 daily by mouth 28 tablet 11  ? Loratadine (CLARITIN PO) Take by mouth.    ? amoxicillin (AMOXIL) 500 MG capsule Take 1 capsule (500 mg total) by mouth 3 (three) times daily. (Patient not taking: Reported on 11/04/2021) 21 capsule 0  ? ?No  facility-administered medications prior to visit.  ? ? ?Allergies  ?Allergen Reactions  ? Sulfa Antibiotics Rash  ? ? ?ROS ?Review of Systems  ?Constitutional:  Negative for chills and fever.  ?HENT:  Positive for ear pain (bl ear fullness), postnasal drip, sinus pressure and sore throat (hoarsness of throat). Negative for congestion.   ?Respiratory:  Positive for cough (dy nonproductive). Negative for shortness of breath and wheezing.   ?Cardiovascular:  Negative for chest pain and palpitations.  ? ?  ?Objective:  ?  ?Physical Exam ?Constitutional:   ?   General: She is not in acute distress. ?   Appearance: She is obese. She is not ill-appearing, toxic-appearing or diaphoretic.  ?HENT:  ?   Head: Normocephalic.  ?   Right Ear: Hearing normal. A middle ear effusion is present. Tympanic membrane is erythematous.  ?   Left Ear: Hearing and tympanic membrane normal.  ?   Nose:  ?   Right Sinus: Maxillary sinus tenderness present.  ?   Left Sinus: Maxillary sinus tenderness present.  ?   Mouth/Throat:  ?   Mouth: Mucous membranes are moist.  ?Eyes:  ?   Pupils: Pupils are equal, round, and reactive to light.  ?Cardiovascular:  ?   Rate and Rhythm: Normal rate and regular rhythm.  ?Pulmonary:  ?   Effort: Pulmonary effort is normal.  ?   Breath sounds: Normal breath sounds.  ?Musculoskeletal:  ?   Cervical back: Normal range of motion.  ?Neurological:  ?   Mental Status: She is alert.  ? ? ?BP 130/82   Pulse 97   Temp 98.3 ?F (36.8 ?C)   Resp 16   Ht '5\' 6"'$  (1.676 m)   Wt 299 lb 3 oz (135.7 kg)   LMP 10/23/2021 (Exact Date)   SpO2 98%   BMI 48.29 kg/m?  ?Wt Readings from Last 3 Encounters:  ?11/04/21 299 lb 3 oz (135.7 kg)  ?10/25/21 298 lb 6.4 oz (135.4 kg)  ?09/24/21 300 lb (136.1 kg)  ? ? ? ?Health Maintenance Due  ?Topic Date Due  ? COVID-19 Vaccine (1) Never done  ? HPV VACCINES (1 - 2-dose series) Never done  ? HIV Screening  Never done  ? Hepatitis C Screening  Never done  ? TETANUS/TDAP  Never done  ? ? ?    ?Topic Date Due  ? HPV VACCINES (1 - 2-dose series) Never done  ? ? ?Lab Results  ?Component Value Date  ?  TSH 1.31 09/24/2021  ? ?Lab Results  ?Component Value Date  ? WBC 9.3 06/12/2021  ? HGB 13.7 06/12/2021  ? HCT 41.4 06/12/2021  ? MCV 86.3 06/12/2021  ? PLT 330 06/12/2021  ? ?Lab Results  ?Component Value Date  ? NA 139 06/12/2021  ? K 3.5 06/12/2021  ? CO2 27 06/12/2021  ? GLUCOSE 95 06/12/2021  ? BUN 10 06/12/2021  ? CREATININE 0.61 06/12/2021  ? BILITOT 0.3 07/02/2020  ? ALKPHOS 64 07/02/2020  ? AST 14 07/02/2020  ? ALT 15 07/02/2020  ? PROT 7.2 07/02/2020  ? ALBUMIN 4.4 07/02/2020  ? CALCIUM 9.5 06/12/2021  ? ANIONGAP 10 06/12/2021  ? ?Lab Results  ?Component Value Date  ? HGBA1C 5.5 09/24/2021  ? ? ?  ?Assessment & Plan:  ? ?Problem List Items Addressed This Visit   ? ?  ? Respiratory  ? Acute non-recurrent frontal sinusitis  ?  Sinusitis vs right aom ?rx cefdinir 300 mg ?Pt to continue tylenol/ibuprofen prn sinus pain. Continue with humidifier prn and steam showers recommended as well. instructed If no symptom improvement in 48 hours please f/u ? ?  ?  ? Relevant Medications  ? levocetirizine (XYZAL) 5 MG tablet  ? cefdinir (OMNICEF) 300 MG capsule  ? predniSONE (DELTASONE) 20 MG tablet  ? Seasonal allergic rhinitis due to pollen - Primary  ?  Recommend start flonase 50 mcg  ?Start zyrtec10 or xyzal 5 mg  ?  ?  ? Relevant Medications  ? levocetirizine (XYZAL) 5 MG tablet  ? predniSONE (DELTASONE) 20 MG tablet  ? ? ?Meds ordered this encounter  ?Medications  ? levocetirizine (XYZAL) 5 MG tablet  ?  Sig: Take 1 tablet (5 mg total) by mouth every evening.  ?  Dispense:  90 tablet  ?  Refill:  1  ?  Order Specific Question:   Supervising Provider  ?  Answer:   BEDSOLE, AMY E [[id number]]  ? cefdinir (OMNICEF) 300 MG capsule  ?  Sig: Take 1 capsule (300 mg total) by mouth 2 (two) times daily.  ?  Dispense:  10 capsule  ?  Refill:  0  ?  Order Specific Question:   Supervising Provider  ?  Answer:   BEDSOLE, AMY E  [[id number]]  ? predniSONE (DELTASONE) 20 MG tablet  ?  Sig: Take two tablets daily for 3 days followed by one tablet daily for 4 days  ?  Dispense:  10 tablet  ?  Refill:  0  ?  Order Specific Question:   Supervising

## 2021-11-04 NOTE — Assessment & Plan Note (Signed)
Recommend start flonase 50 mcg  ?Start zyrtec10 or xyzal 5 mg  ?

## 2021-11-04 NOTE — Assessment & Plan Note (Signed)
Sinusitis vs right aom ?rx cefdinir 300 mg ?Pt to continue tylenol/ibuprofen prn sinus pain. Continue with humidifier prn and steam showers recommended as well. instructed If no symptom improvement in 48 hours please f/u ? ?

## 2021-11-08 ENCOUNTER — Encounter: Payer: Self-pay | Admitting: Family

## 2021-11-10 ENCOUNTER — Other Ambulatory Visit: Payer: Self-pay | Admitting: Family

## 2021-11-10 DIAGNOSIS — H9201 Otalgia, right ear: Secondary | ICD-10-CM

## 2021-11-15 ENCOUNTER — Telehealth: Payer: Self-pay | Admitting: Family

## 2021-11-15 NOTE — Telephone Encounter (Signed)
Pt called checking on the status of her referral to the EAR NOSE and THROAT. Please advise. ?

## 2021-11-17 NOTE — Telephone Encounter (Signed)
Pt called in again today asking for the referral to ENT in [place] be changed to STAT since the pain is getting worse and she still hasn't heard anything from anyone.  ?

## 2021-11-22 ENCOUNTER — Encounter: Payer: Self-pay | Admitting: Family

## 2021-11-22 ENCOUNTER — Ambulatory Visit: Payer: Medicaid Other | Admitting: Family

## 2021-11-23 ENCOUNTER — Encounter: Payer: Self-pay | Admitting: *Deleted

## 2021-11-29 DIAGNOSIS — Z419 Encounter for procedure for purposes other than remedying health state, unspecified: Secondary | ICD-10-CM | POA: Diagnosis not present

## 2021-11-30 NOTE — Telephone Encounter (Signed)
See mychart 11/23/21 ? ?Pt made aware of where referral was sent ?

## 2021-12-15 ENCOUNTER — Ambulatory Visit (INDEPENDENT_AMBULATORY_CARE_PROVIDER_SITE_OTHER): Payer: Medicaid Other | Admitting: Adult Health

## 2021-12-15 ENCOUNTER — Encounter: Payer: Self-pay | Admitting: Adult Health

## 2021-12-15 DIAGNOSIS — R6882 Decreased libido: Secondary | ICD-10-CM | POA: Diagnosis not present

## 2021-12-15 DIAGNOSIS — Z319 Encounter for procreative management, unspecified: Secondary | ICD-10-CM | POA: Diagnosis not present

## 2021-12-15 NOTE — Progress Notes (Signed)
Patient ID: Susan Vazquez, female   DOB: March 06, 2001, 21 y.o.   MRN: [id number] ? ? ?TELEHEALTH GYNECOLOGY VISIT ENCOUNTER NOTE ? ?Provider location: [facility]  ? ?Patient location: Home ? ?I connected with Susan Vazquez on 12/15/21 at  2:10 PM EDT by telephone and verified that I am speaking with the correct person using two identifiers. Patient was unable to do MyChart audiovisual encounter due to technical difficulties, she tried several times.  ?  ?I discussed the limitations, risks, security and privacy concerns of performing an evaluation and management service by telephone and the availability of in person appointments. I also discussed with the patient that there may be a patient responsible charge related to this service. The patient expressed understanding and agreed to proceed. ?  ?History:  ?ETHER GOEBEL is a 21 y.o. G0P0000 female being evaluated today for decreased libido on Buspar 5 mg daily and feels like does not need any more and wants to get pregnant. She denies any other concerns.   ?  ?  ?Past Medical History:  ?Diagnosis Date  ? ASCUS with positive high risk HPV cervical 10/29/2021  ? 10/29/21 repeat in 1 year per ASCCP guidelines   ? Irregular periods 07/02/2020  ? Screening for diabetes mellitus 07/02/2020  ? Seasonal allergies   ? Weight gain 07/02/2020  ? ?Past Surgical History:  ?Procedure Laterality Date  ? ADENOIDECTOMY Bilateral 2007  ? TONSILLECTOMY Bilateral   ? 2007  ? WISDOM TOOTH EXTRACTION    ? ?The following portions of the patient's history were reviewed and updated as appropriate: allergies, current medications, past family history, past medical history, past social history, past surgical history and problem list.  ? ?Health Maintenance:  ?Lab Results  ?Component Value Date  ? DIAGPAP (A) 10/25/2021  ?  - Atypical squamous cells of undetermined significance (ASC-US)  ? [place] Positive (A) 10/25/2021  ?  ? ?Review of Systems:  ?Pertinent items  noted in HPI and remainder of comprehensive ROS otherwise negative. ? ?Physical Exam:  ? ?General:  Alert, oriented and cooperative.   ?Mental Status: Normal mood and affect perceived. Normal judgment and thought content.  ?Physical exam deferred due to nature of the encounter ? ?  12/15/2021  ?  1:57 PM 10/25/2021  ? 11:05 AM 09/24/2021  ? 12:37 PM  ?Depression screen PHQ 2/9  ?Decreased Interest 1 0 1  ?Down, Depressed, Hopeless 0 0 0  ?PHQ - 2 Score 1 0 1  ?Altered sleeping 0 0 1  ?Tired, decreased energy 1 0 1  ?Change in appetite 0 0 0  ?Feeling bad or failure about yourself  0 0 0  ?Trouble concentrating 0 0 0  ?Moving slowly or fidgety/restless 0 0 0  ?Suicidal thoughts 0 0 0  ?PHQ-9 Score 2 0 3  ?Difficult doing work/chores   Not difficult at all  ?  ? ?  12/15/2021  ?  1:59 PM 10/25/2021  ? 11:05 AM 09/24/2021  ? 12:38 PM 08/30/2021  ?  4:08 PM  ?GAD 7 : Generalized Anxiety Score  ?Nervous, Anxious, on Edge 0 0 1 1  ?Control/stop worrying 0 0 0 1  ?Worry too much - different things 0 0 0 0  ?Trouble relaxing 0 1 0 1  ?Restless 0 0  0  ?Easily annoyed or irritable '1 1 1 3  '$ ?Afraid - awful might happen 0 0 0 0  ?Total GAD 7 Score '1 2  6  '$ ?  Anxiety Difficulty   Not difficult at all Not difficult at all  ?  ?  ?Labs and Imaging ?No results found for this or any previous visit (from the past 336 hour(s)). ?No results found.    ?Assessment and Plan:  ?   ?1. Patient desires pregnancy ?Start OTC PNV ?Stop OCs with this pack ?Can start trying  with next period ?Discussed timing of sex ? ?2. Decreased libido ?Can stop Buspar ? ?   ?  ?I discussed the assessment and treatment plan with the patient. The patient was provided an opportunity to ask questions and all were answered. The patient agreed with the plan and demonstrated an understanding of the instructions. ?  ?The patient was advised to call back or seek an in-person evaluation/go to the ED if the symptoms worsen or if the condition fails to improve as  anticipated. ? ?I provided 10 minutes of non-face-to-face time during this encounter. ?I was in my office at [facility] tree for this encounter ? ?Derrek Monaco, NP ?[facility] [place]  ?

## 2021-12-16 ENCOUNTER — Encounter: Payer: Self-pay | Admitting: Family

## 2021-12-16 ENCOUNTER — Other Ambulatory Visit: Payer: Self-pay | Admitting: Family

## 2021-12-16 MED ORDER — FLUTICASONE PROPIONATE 50 MCG/ACT NA SUSP: 2.0000 | Freq: Every day | NASAL | 6 refills | Status: DC

## 2021-12-30 DIAGNOSIS — Z419 Encounter for procedure for purposes other than remedying health state, unspecified: Secondary | ICD-10-CM | POA: Diagnosis not present

## 2022-01-29 DIAGNOSIS — Z419 Encounter for procedure for purposes other than remedying health state, unspecified: Secondary | ICD-10-CM | POA: Diagnosis not present

## 2022-03-01 ENCOUNTER — Other Ambulatory Visit: Payer: Self-pay | Admitting: Adult Health

## 2022-03-01 DIAGNOSIS — N926 Irregular menstruation, unspecified: Secondary | ICD-10-CM | POA: Diagnosis not present

## 2022-03-01 DIAGNOSIS — Z419 Encounter for procedure for purposes other than remedying health state, unspecified: Secondary | ICD-10-CM | POA: Diagnosis not present

## 2022-03-01 NOTE — Progress Notes (Signed)
Ck QHCG missed period

## 2022-03-02 LAB — BETA HCG QUANT (REF LAB): hCG Quant: <1 m[IU]/mL

## 2022-03-14 ENCOUNTER — Ambulatory Visit (INDEPENDENT_AMBULATORY_CARE_PROVIDER_SITE_OTHER): Payer: Medicaid Other | Admitting: Family Medicine

## 2022-03-14 ENCOUNTER — Encounter: Payer: Self-pay | Admitting: Family Medicine

## 2022-03-14 ENCOUNTER — Other Ambulatory Visit: Payer: Self-pay

## 2022-03-14 VITALS — BP 114/74 | HR 77 | Wt 302.8 lb

## 2022-03-14 DIAGNOSIS — Z319 Encounter for procreative management, unspecified: Secondary | ICD-10-CM | POA: Diagnosis not present

## 2022-03-14 MED ORDER — MEDROXYPROGESTERONE ACETATE 10 MG PO TABS: 10.0000 mg | ORAL_TABLET | Freq: Every day | ORAL | 3 refills | Status: DC

## 2022-03-14 MED ORDER — LETROZOLE 2.5 MG PO TABS: 2.5000 mg | ORAL_TABLET | Freq: Every day | ORAL | 3 refills | Status: DC

## 2022-03-14 NOTE — Progress Notes (Signed)
    SUBJECTIVE:   CHIEF COMPLAINT / HPI:   Fertility counseling  Patient reports very to the clinic for evaluation for fertility concerns.  She was on oral birth controls until April 2023 when she discontinued.  She was only birth control to help regulate her periods.  Reports that since discontinuing the birth control she has had 2 menses with the most recent one being in June.  She had a quantitative beta-hCG test on 8/1 which resulted as less than 1.  She has not been sexually active since then.  She has not had menses since having the blood work drawn.  Reports that she is actively trying to get pregnant.  She would like assistance with this and has concerns about her ability to become pregnant due to her history of PCOS.  PERTINENT  PMH / PSH: PCOS  OBJECTIVE:   BP 114/74   Pulse 77   Wt (!) 302 lb 12.8 oz (137.3 kg)   LMP 01/21/2022 (Exact Date)   BMI 48.87 kg/m   General: Pleasant, well-appearing 21 year old female Respiratory: Breathing, speaking full sentences Cardiac: Regular rate MSK: No gross abnormalities, ambulates without difficulty  ASSESSMENT/PLAN:   Patient desires pregnancy Patient presenting for discussion regarding assistance with getting pregnant.  History of PCOS.  Was on OCPs until approximately 3 months ago.  Amenorrheic for the last month and a half.  Beta-hCG on 8/1 was less than 1.  Has not been sexually active since then.  Will prescribe Provera to initiate menses.  After bleeding starts patient will take 2.5 mg Femara on days 3 through 7 after menses start.  Discussed sexual activity from days 10-20 and EPC on day 30 if patient's menses has not started.  She may have to repeat this cycle.  Discussed use of ovulation prediction kits and if not ovulating could increase Femara to 5 mg after 3 months.  If she is not pregnant in 6 months we will consider semen analysis and HSG.  Return as needed.     Concepcion Living, MD [place]

## 2022-03-14 NOTE — Assessment & Plan Note (Signed)
Patient presenting for discussion regarding assistance with getting pregnant.  History of PCOS.  Was on OCPs until approximately 3 months ago.  Amenorrheic for the last month and a half.  Beta-hCG on 8/1 was less than 1.  Has not been sexually active since then.  Will prescribe Provera to initiate menses.  After bleeding starts patient will take 2.5 mg Femara on days 3 through 7 after menses start.  Discussed sexual activity from days 10-20 and EPC on day 30 if patient's menses has not started.  She may have to repeat this cycle.  Discussed use of ovulation prediction kits and if not ovulating could increase Femara to 5 mg after 3 months.  If she is not pregnant in 6 months we will consider semen analysis and HSG.  Return as needed.

## 2022-03-14 NOTE — Patient Instructions (Addendum)
It was a pleasure seeing you today.  Because of your PCOS unable to initiate some medications to help you ovulate.  I sent a prescription for medication called Provera to your pharmacy which will cause you to have a period.  You will take 10 mg daily until it starts and then you can stop taking the Provera.  The day of your period starts will be day one of the cycle.  On day 3 you will start taking a medication called Femara 2.5 mg daily.  You will take this day 3 to day 7.  From day 10 today 20 of the cycle you should have intercourse every other day.  If your period does not start on day 30 I would like for you to take a pregnancy test.  If it is negative you will start the process all over again starting with the Provera.  I have sent refills in for the Provera as well as for the Femara.  You can consider using the ovulation prediction kit that we discussed in that 10 to 20-day window and if you are not ovulating after 3 months we can consider increasing the dose of the Femara.  If you are not pregnant within 6 months we can consider other test that need to be run.  If you have any questions please give us a call or you can send us a MyChart message.  I hope you have a great day! 

## 2022-03-15 ENCOUNTER — Encounter: Payer: Medicaid Other | Admitting: Student

## 2022-03-16 ENCOUNTER — Ambulatory Visit (INDEPENDENT_AMBULATORY_CARE_PROVIDER_SITE_OTHER): Payer: Medicaid Other | Admitting: Dermatology

## 2022-03-16 DIAGNOSIS — Z1283 Encounter for screening for malignant neoplasm of skin: Secondary | ICD-10-CM | POA: Diagnosis not present

## 2022-03-16 DIAGNOSIS — D2222 Melanocytic nevi of left ear and external auricular canal: Secondary | ICD-10-CM | POA: Diagnosis not present

## 2022-03-16 DIAGNOSIS — L814 Other melanin hyperpigmentation: Secondary | ICD-10-CM | POA: Diagnosis not present

## 2022-03-16 DIAGNOSIS — D229 Melanocytic nevi, unspecified: Secondary | ICD-10-CM

## 2022-03-16 DIAGNOSIS — L578 Other skin changes due to chronic exposure to nonionizing radiation: Secondary | ICD-10-CM | POA: Diagnosis not present

## 2022-03-16 DIAGNOSIS — D1800 Hemangioma unspecified site: Secondary | ICD-10-CM

## 2022-03-16 DIAGNOSIS — Z808 Family history of malignant neoplasm of other organs or systems: Secondary | ICD-10-CM

## 2022-03-16 DIAGNOSIS — D485 Neoplasm of uncertain behavior of skin: Secondary | ICD-10-CM

## 2022-03-16 HISTORY — DX: Melanocytic nevi, unspecified: D22.9

## 2022-03-16 NOTE — Patient Instructions (Addendum)
PRE-OPERATIVE INSTRUCTIONS  We recommend you read the following instructions. If you have any questions or concerns, please call the office at [phone].  Shower and wash the entire body with soap and water the day of your surgery paying special attention to cleansing at and around the planned surgery site. Avoid aspirin or aspirin containing products at least ten (10) days prior to your surgical procedure and for at least one week (7 days) after your surgical procedure. If you take aspirin on a regular basis for heart disease or history of stroke or for any other reason, we may recommend you continue taking aspirin but please notify us if you take this on a regular basis. Aspirin can cause more bleeding to occur during surgery as well as prolonged bleeding and bruising after surgery. Avoid other nonsteroidal pain medications at least one week prior to surgery and at least one week after your surgery. These include medications such as Ibuprofen (Motrin, Advil and Nuprin), Naprosyn, Voltaren, Relafen, etc. If these medications are used for therapeutic reasons, please inform us as they can cause increased bleeding or prolonged bleeding during and bruising after surgical procedures.  Please advise [place] if you are taking any "blood thinner" medications such as Coumadin or Dipyridamole or Plavix or similar medications. These cause increased bleeding and prolonged bleeding during procedures and bruising after surgical procedures. We may have to consider discontinuing these medications briefly prior to and shortly after your surgery if safe to do so. Please inform us of all medications you are currently taking. All medications that are taken regularly should be taken the day of surgery as you always do. Nevertheless, we need to be informed of what medications you are taking prior to surgery to know whether they will affect the procedure or cause any complications. Please inform us of any medication allergies.  Also inform us of whether you have allergies to Latex or rubber products or whether you have had any adverse reaction to Lidocaine or Epinephrine. Please inform us of any prosthetic or artificial body parts such as artificial heart valve, joint replacements, etc., or similar condition that might require preoperative antibiotics. We recommend avoidance of alcohol at least two weeks prior to surgery and continued avoidance for at least two weeks after surgery. We recommend avoidance of tobacco smoking at least two weeks prior to surgery and continued abstinence for at least two weeks after surgery. Do not plan strenuous exercise, strenuous work or strenuous lifting for approximately four weeks after your surgery. We request if you are unable to make your scheduled surgical appointment, please call us at least a week in advance or as soon as you are aware of a problem so that we can cancel or reschedule the appointment. You MAY TAKE TYLENOL (acetaminophen) for pain as it is not a blood thinner. PLEASE PLAN TO BE IN TOWN FOR TWO WEEKS FOLLOWING SURGERY. THIS IS IMPORTANT SO YOU CAN BE CHECKED FOR DRESSING CHANGES, SUTURE REMOVAL AND TO MONITOR FOR POSSIBLE COMPLICATIONS.   Wound Care Instructions  Cleanse wound gently with soap and water once a day then pat dry with clean gauze. Apply a thin coat of Petrolatum (petroleum jelly, "Vaseline") over the wound (unless you have an allergy to this). We recommend that you use a new, sterile tube of Vaseline. Do not pick or remove scabs. Do not remove the yellow or white "healing tissue" from the base of the wound.  Cover the wound with fresh, clean, nonstick gauze and secure with paper tape. You  may use Band-Aids in place of gauze and tape if the wound is small enough, but would recommend trimming much of the tape off as there is often too much. Sometimes Band-Aids can irritate the skin.  You should call the office for your biopsy report after 1 week if you have  not already been contacted.  If you experience any problems, such as abnormal amounts of bleeding, swelling, significant bruising, significant pain, or evidence of infection, please call the office immediately.  FOR ADULT SURGERY PATIENTS: If you need something for pain relief you may take 1 extra strength Tylenol (acetaminophen) AND 2 Ibuprofen ('200mg'$  each) together every 4 hours as needed for pain. (do not take these if you are allergic to them or if you have a reason you should not take them.) Typically, you may only need pain medication for 1 to 3 days.     Due to recent changes in healthcare laws, you may see results of your pathology and/or laboratory studies on MyChart before the doctors have had a chance to review them. We understand that in some cases there may be results that are confusing or concerning to you. Please understand that not all results are received at the same time and often the doctors may need to interpret multiple results in order to provide you with the best plan of care or course of treatment. Therefore, we ask that you please give [place] 2 business days to thoroughly review all your results before contacting the office for clarification. Should we see a critical lab result, you will be contacted sooner.   If You Need Anything After Your Visit  If you have any questions or concerns for your doctor, please call our main line at [phone] and press option 4 to reach your doctor's medical assistant. If no one answers, please leave a voicemail as directed and we will return your call as soon as possible. Messages left after 4 pm will be answered the following business day.   You may also send [place] a message via [place]. We typically respond to MyChart messages within 1-2 business days.  For prescription refills, please ask your pharmacy to contact our office. Our fax number is [phone].  If you have an urgent issue when the clinic is closed that cannot wait until the next  business day, you can page your doctor at the number below.    Please note that while we do our best to be available for urgent issues outside of office hours, we are not available 24/7.   If you have an urgent issue and are unable to reach [place], you may choose to seek medical care at your doctor's office, retail clinic, urgent care center, or emergency room.  If you have a medical emergency, please immediately call 911 or go to the emergency department.  Pager Numbers  - Dr. Nehemiah Massed: [phone]  - Dr. Laurence Ferrari: [phone]  - Dr. Nicole Kindred: [phone]  In the event of inclement weather, please call our main line at [phone] for an update on the status of any delays or closures.  Dermatology Medication Tips: Please keep the boxes that topical medications come in in order to help keep track of the instructions about where and how to use these. Pharmacies typically print the medication instructions only on the boxes and not directly on the medication tubes.   If your medication is too expensive, please contact our office at [phone] option 4 or send [place] a message through [place].   We are  unable to tell what your co-pay for medications will be in advance as this is different depending on your insurance coverage. However, we may be able to find a substitute medication at lower cost or fill out paperwork to get insurance to cover a needed medication.   If a prior authorization is required to get your medication covered by your insurance company, please allow [place] 1-2 business days to complete this process.  Drug prices often vary depending on where the prescription is filled and some pharmacies may offer cheaper prices.  The website www.goodrx.com contains coupons for medications through different pharmacies. The prices here do not account for what the cost may be with help from insurance (it may be cheaper with your insurance), but the website can give you the price if you did not use  any insurance.  - You can print the associated coupon and take it with your prescription to the pharmacy.  - You may also stop by our office during regular business hours and pick up a GoodRx coupon card.  - If you need your prescription sent electronically to a different pharmacy, notify our office through [facility] or by phone at [phone] option 4.     Si Usted Necesita Algo Despus de Su Visita  Tambin puede enviarnos un mensaje a travs de Pharmacist, community. Por lo general respondemos a los mensajes de MyChart en el transcurso de 1 a 2 das hbiles.  Para renovar recetas, por favor pida a su farmacia que se ponga en contacto con nuestra oficina. Harland Dingwall de fax es [place] [phone].  Si tiene un asunto urgente cuando la clnica est cerrada y que no puede esperar hasta el siguiente da hbil, puede llamar/localizar a su doctor(a) al nmero que aparece a continuacin.   Por favor, tenga en cuenta que aunque hacemos todo lo posible para estar disponibles para asuntos urgentes fuera del horario de [place], no estamos disponibles las 24 horas del da, los 7 das de la [place].   Si tiene un problema urgente y no puede comunicarse con nosotros, puede optar por buscar atencin mdica  en el consultorio de su doctor(a), en una clnica privada, en un centro de atencin urgente o en una sala de emergencias.  Si tiene Engineering geologist, por favor llame inmediatamente al 911 o vaya a la sala de emergencias.  Nmeros de bper  - Dr. Nehemiah Massed: [phone]  - Dra. Moye: [phone]  - Dra. Nicole Kindred: [phone]  En caso de inclemencias del [place], por favor llame a Johnsie Kindred principal al [phone] para una actualizacin sobre el [place] de cualquier retraso o cierre.  Consejos para la medicacin en dermatologa: Por favor, guarde las cajas en las que vienen los medicamentos de uso tpico para ayudarle a seguir las instrucciones sobre dnde y cmo usarlos. Las farmacias  generalmente imprimen las instrucciones del medicamento slo en las cajas y no directamente en los tubos del [place].   Si su medicamento es muy caro, por favor, pngase en contacto con Zigmund Daniel llamando al [phone] y presione la opcin 4 o envenos un mensaje a travs de Pharmacist, community.   No podemos decirle cul ser su copago por los medicamentos por adelantado ya que esto es diferente dependiendo de la cobertura de su seguro. Sin embargo, es posible que podamos encontrar un medicamento sustituto a Electrical engineer un formulario para que el seguro cubra el medicamento que se considera necesario.   Si se requiere una autorizacin previa para que su compaa de  seguros [place] su medicamento, por favor permtanos de 1 a 2 das hbiles para completar este proceso.  Los precios de los medicamentos varan con frecuencia dependiendo del Environmental consultant de dnde se surte la receta y alguna farmacias pueden ofrecer precios ms baratos.  El sitio web www.goodrx.com tiene cupones para medicamentos de Airline pilot. Los precios aqu no tienen en cuenta lo que podra costar con la ayuda del seguro (puede ser ms barato con su seguro), pero el sitio web puede darle el precio si no utiliz Research scientist (physical sciences).  - Puede imprimir el cupn correspondiente y llevarlo con su receta a la farmacia.  - Tambin puede pasar por nuestra oficina durante el horario de atencin regular y Charity fundraiser una tarjeta de cupones de GoodRx.  - Si necesita que su receta se enve electrnicamente a una farmacia diferente, informe a nuestra oficina a travs de MyChart de [place] o por telfono llamando al [phone] y presione la opcin 4.

## 2022-03-16 NOTE — Progress Notes (Signed)
New Patient Visit  Subjective  Susan Vazquez is a 21 y.o. female who presents for the following: Other (New patient - Family history of Melanoma - The patient presents for Total-Body Skin Exam (TBSE) for skin cancer screening and mole check.  The patient has spots, moles and lesions to be evaluated, some may be new or changing and the patient has concerns that these could be cancer./).  The following portions of the chart were reviewed this encounter and updated as appropriate:   Tobacco  Allergies  Meds  Problems  Med Hx  Surg Hx  Fam Hx     Review of Systems:  No other skin or systemic complaints except as noted in HPI or Assessment and Plan.  Objective  Well appearing patient in no apparent distress; mood and affect are within normal limits.  A full examination was performed including scalp, head, eyes, ears, nose, lips, neck, chest, axillae, abdomen, back, buttocks, bilateral upper extremities, bilateral lower extremities, hands, feet, fingers, toes, fingernails, and toenails. All findings within normal limits unless otherwise noted below.  Left Ear mid to sup helix 0.6 cm irregular brown macule     Right buttock 0.8 cm firm papule      Assessment & Plan   Family history of skin cancer   Lentigines - Scattered tan macules - Due to sun exposure - Benign-appearing, observe - Recommend daily broad spectrum sunscreen SPF 30+ to sun-exposed areas, reapply every 2 hours as needed. - Call for any changes  Melanocytic Nevi - Tan-brown and/or pink-flesh-colored symmetric macules and papules - Benign appearing on exam today - Observation - Call clinic for new or changing moles - Recommend daily use of broad spectrum spf 30+ sunscreen to sun-exposed areas.   Hemangiomas - Red papules - Discussed benign nature - Observe - Call for any changes  Actinic Damage - Chronic condition, secondary to cumulative UV/sun exposure - diffuse scaly erythematous macules  with underlying dyspigmentation - Recommend daily broad spectrum sunscreen SPF 30+ to sun-exposed areas, reapply every 2 hours as needed.  - Staying in the shade or wearing long sleeves, sun glasses (UVA+UVB protection) and wide brim hats (4-inch brim around the entire circumference of the hat) are also recommended for sun protection.  - Call for new or changing lesions.  Skin cancer screening performed today.  Neoplasm of uncertain behavior of skin (2) Left Ear mid to sup helix Epidermal / dermal shaving  Lesion diameter (cm):  0.6 Informed consent: discussed and consent obtained   Timeout: patient name, date of birth, surgical site, and procedure verified   Procedure prep:  Patient was prepped and draped in usual sterile fashion Prep type:  Isopropyl alcohol Anesthesia: the lesion was anesthetized in a standard fashion   Anesthetic:  1% lidocaine w/ epinephrine 1-100,000 buffered w/ 8.4% NaHCO3 Instrument used: flexible razor blade   Hemostasis achieved with: pressure, aluminum chloride and electrodesiccation   Outcome: patient tolerated procedure well   Post-procedure details: sterile dressing applied and wound care instructions given   Dressing type: bandage and petrolatum    Specimen 1 - Surgical pathology Differential Diagnosis: Nevus vs dysplastic nevus  Check Margins: No  Right buttock Dermatofibroma vs cyst vs hemangioma of right buttock - will plan excision on follow up  Return for Surgery right buttock.  I, Ashok Cordia, CMA, am acting as scribe for Sarina Ser, MD . Documentation: I have reviewed the above documentation for accuracy and completeness, and I agree with the above.  Shanon Brow  Nehemiah Massed, MD

## 2022-03-21 ENCOUNTER — Telehealth: Payer: Self-pay

## 2022-03-21 NOTE — Telephone Encounter (Signed)
-----   Message from Ralene Bathe, MD sent at 03/21/2022 11:13 AM EDT ----- Diagnosis Skin , left ear mid to sup helix DYSPLASTIC COMPOUND NEVUS WITH SEVERE ATYPIA, DEEP MARGIN INVOLVED, SEE DESCRIPTION  Severe dysplastic Schedule surgery

## 2022-03-21 NOTE — Telephone Encounter (Signed)
Patient informed of pathology results and appointment scheduled.  °

## 2022-03-22 ENCOUNTER — Encounter: Payer: Self-pay | Admitting: Dermatology

## 2022-03-24 ENCOUNTER — Ambulatory Visit: Payer: Medicaid Other | Admitting: Family

## 2022-03-24 ENCOUNTER — Telehealth: Payer: Self-pay

## 2022-03-24 NOTE — Telephone Encounter (Signed)
Pt called and left VM on nurse line.  Call placed back to pt. Spoke with pt. Pt states taking last Provera Rx pill yesterday and still does not have withdrawal bleed. Pt advised that this is normal and it can take up to 2 weeks after finishing the Rx Provera to have a bleed. If bleeding does start, reminded patient to take Rx Femara starting on day 3. Pt verbalized understanding. Will give [place] updates when has questions or concerns.  Tensed

## 2022-03-28 ENCOUNTER — Encounter: Payer: Self-pay | Admitting: Family

## 2022-03-28 ENCOUNTER — Ambulatory Visit (INDEPENDENT_AMBULATORY_CARE_PROVIDER_SITE_OTHER): Payer: Medicaid Other | Admitting: Family

## 2022-03-28 VITALS — BP 98/64 | HR 92 | Temp 98.6°F | Resp 16 | Ht 66.0 in | Wt 304.1 lb

## 2022-03-28 DIAGNOSIS — E282 Polycystic ovarian syndrome: Secondary | ICD-10-CM

## 2022-03-28 DIAGNOSIS — Z319 Encounter for procreative management, unspecified: Secondary | ICD-10-CM | POA: Diagnosis not present

## 2022-03-28 DIAGNOSIS — Z6841 Body Mass Index (BMI) 40.0 and over, adult: Secondary | ICD-10-CM | POA: Diagnosis not present

## 2022-03-28 DIAGNOSIS — Z0001 Encounter for general adult medical examination with abnormal findings: Secondary | ICD-10-CM | POA: Diagnosis not present

## 2022-03-28 DIAGNOSIS — R87810 Cervical high risk human papillomavirus (HPV) DNA test positive: Secondary | ICD-10-CM

## 2022-03-28 DIAGNOSIS — R87610 Atypical squamous cells of undetermined significance on cytologic smear of cervix (ASC-US): Secondary | ICD-10-CM | POA: Diagnosis not present

## 2022-03-28 DIAGNOSIS — J301 Allergic rhinitis due to pollen: Secondary | ICD-10-CM

## 2022-03-28 DIAGNOSIS — Z13220 Encounter for screening for lipoid disorders: Secondary | ICD-10-CM | POA: Insufficient documentation

## 2022-03-28 DIAGNOSIS — F419 Anxiety disorder, unspecified: Secondary | ICD-10-CM

## 2022-03-28 LAB — CBC WITH DIFFERENTIAL/PLATELET
Basophils Absolute: 0 10*3/uL (ref 0.0–0.1)
Basophils Relative: 0.6 % (ref 0.0–3.0)
Eosinophils Absolute: 0.2 10*3/uL (ref 0.0–0.7)
Eosinophils Relative: 2.1 % (ref 0.0–5.0)
HCT: 39.4 % (ref 36.0–46.0)
Hemoglobin: 13.5 g/dL (ref 12.0–15.0)
Lymphocytes Relative: 25.1 % (ref 12.0–46.0)
Lymphs Abs: 2.2 10*3/uL (ref 0.7–4.0)
MCHC: 34.2 g/dL (ref 30.0–36.0)
MCV: 84.8 fl (ref 78.0–100.0)
Monocytes Absolute: 0.5 10*3/uL (ref 0.1–1.0)
Monocytes Relative: 6.3 % (ref 3.0–12.0)
Neutro Abs: 5.7 10*3/uL (ref 1.4–7.7)
Neutrophils Relative %: 65.9 % (ref 43.0–77.0)
Platelets: 278 10*3/uL (ref 150.0–400.0)
RBC: 4.65 Mil/uL (ref 3.87–5.11)
RDW: 13.1 % (ref 11.5–15.5)
WBC: 8.7 10*3/uL (ref 4.0–10.5)

## 2022-03-28 LAB — LIPID PANEL
Cholesterol: 138 mg/dL (ref 0–200)
HDL: 37.3 mg/dL — ABNORMAL LOW (ref >39.00)
LDL Cholesterol: 79 mg/dL (ref 0–99)
NonHDL: 100.23
Total CHOL/HDL Ratio: 4
Triglycerides: 105 mg/dL (ref 0.0–149.0)
VLDL: 21 mg/dL (ref 0.0–40.0)

## 2022-03-28 LAB — COMPREHENSIVE METABOLIC PANEL
ALT: 20 U/L (ref 0–35)
AST: 16 U/L (ref 0–37)
Albumin: 4.4 g/dL (ref 3.5–5.2)
Alkaline Phosphatase: 67 U/L (ref 39–117)
BUN: 10 mg/dL (ref 6–23)
CO2: 26 mEq/L (ref 19–32)
Calcium: 9.4 mg/dL (ref 8.4–10.5)
Chloride: 101 mEq/L (ref 96–112)
Creatinine, Ser: 0.61 mg/dL (ref 0.40–1.20)
GFR: 126.99 mL/min (ref >60.00)
Glucose, Bld: 85 mg/dL (ref 70–99)
Potassium: 4.5 mEq/L (ref 3.5–5.1)
Sodium: 138 mEq/L (ref 135–145)
Total Bilirubin: 0.4 mg/dL (ref 0.2–1.2)
Total Protein: 7.1 g/dL (ref 6.0–8.3)

## 2022-03-28 NOTE — Assessment & Plan Note (Signed)
F/u with gyn, repeat in one year with GYN as scheduled.

## 2022-03-28 NOTE — Assessment & Plan Note (Signed)
Lipid panel ordered pending results.   

## 2022-03-28 NOTE — Assessment & Plan Note (Signed)
Work on diet and exercise 

## 2022-03-28 NOTE — Assessment & Plan Note (Signed)
Patient Counseling(The following topics were reviewed): ? Preventative care handout given to pt  ?Health maintenance and immunizations reviewed. Please refer to Health maintenance section. ?Pt advised on safe sex, wearing seatbelts in car, and proper nutrition ?labwork ordered today for annual ?Dental health: Discussed importance of regular tooth brushing, flossing, and dental visits. ? ? ?

## 2022-03-28 NOTE — Assessment & Plan Note (Signed)
Continue flonase 

## 2022-03-28 NOTE — Assessment & Plan Note (Signed)
Work on anxiety reducing techniques

## 2022-03-28 NOTE — Assessment & Plan Note (Signed)
Continue f/u with gyn as scheduled.

## 2022-03-28 NOTE — Progress Notes (Incomplete)
Established Patient Office Visit  Subjective:  Patient ID: Susan Vazquez, female    DOB: 12/15/2000  Age: 21 y.o. MRN: [id number]  CC:  Chief Complaint  Patient presents with  . Annual Exam    HPI Susan Vazquez is here today for follow up as well as annual physical.   TDAP: believes she had in 2021.   Flu vaccine: we do not yet have in the office.  Declines every being tested for HIV or hepatitis C.   No h/o drug use   No high risk activity history  Not baby boomer status Pt declines STD testing including HIV and hepatitis.   Pap 10/25/21, high risk HPV positive and ASCUS. Recheck in one year from 3/27.  Amenorrhea: started on provera with OBGYN Dr. Caron Presume 03/14/22. Also started on femara 2.5 mg daily. She desires to start a family. She has not yet started withdrawal bleed but was advised by GYN can take up to two weeks.   Dad with h/o melanoma, seen by dermatology. Has appt tomorrow for a few moles that were suspicious, will get them removed.   Past Medical History:  Diagnosis Date  . ASCUS with positive high risk HPV cervical 10/29/2021   10/29/21 repeat in 1 year per ASCCP guidelines   . Atypical mole 03/16/2022   Left Ear mid to sup helix - severe, needs excision  . Irregular periods 07/02/2020  . Screening for diabetes mellitus 07/02/2020  . Seasonal allergies   . Weight gain 07/02/2020    Past Surgical History:  Procedure Laterality Date  . ADENOIDECTOMY Bilateral 2007  . TONSILLECTOMY Bilateral    2007  . WISDOM TOOTH EXTRACTION      Family History  Problem Relation Age of Onset  . Diabetes Mother   . Melanoma Father        stage 3 melonoma cancer  . Dementia Maternal Grandmother   . Suicidality Maternal Grandfather     Social History   Socioeconomic History  . Marital status: Significant Other    Spouse name: Not on file  . Number of children: 0  . Years of education: Not on file  . Highest education level: Not on file  Occupational History   . Occupation: [employer]    Employer: [employer]  Tobacco Use  . Smoking status: Never  . Smokeless tobacco: Never  Vaping Use  . Vaping Use: Former  Substance and Sexual Activity  . Alcohol use: Not Currently  . Drug use: Never  . Sexual activity: Yes    Partners: Male    Birth control/protection: Pill  Other Topics Concern  . Not on file  Social History Narrative  . Not on file   Social Determinants of Health   Financial Resource Strain: Low Risk  (10/25/2021)   Overall Financial Resource Strain (CARDIA)   . Difficulty of Paying Living Expenses: Not hard at all  Food Insecurity: No Food Insecurity (10/25/2021)   Hunger Vital Sign   . Worried About Charity fundraiser in the Last Year: Never true   . Ran Out of Food in the Last Year: Never true  Transportation Needs: No Transportation Needs (10/25/2021)   PRAPARE - Transportation   . Lack of Transportation (Medical): No   . Lack of Transportation (Non-Medical): No  Physical Activity: Insufficiently Active (10/25/2021)   Exercise Vital Sign   . Days of Exercise per Week: 3 days   . Minutes of Exercise per Session: 20 min  Stress: No  Stress Concern Present (10/25/2021)   [place]   . Feeling of Stress : Only a little  Social Connections: Moderately Integrated (10/25/2021)   Social Connection and Isolation Panel [NHANES]   . Frequency of Communication with Friends and Family: More than three times a week   . Frequency of Social Gatherings with Friends and Family: Once a week   . Attends Religious Services: 1 to 4 times per year   . Active Member of Clubs or Organizations: No   . Attends Archivist Meetings: Never   . Marital Status: Living with partner  Intimate Partner Violence: Not At Risk (10/25/2021)   Humiliation, Afraid, Rape, and Kick questionnaire   . Fear of Current or Ex-Partner: No   . Emotionally Abused: No   . Physically Abused: No   .  Sexually Abused: No    Outpatient Medications Prior to Visit  Medication Sig Dispense Refill  . fluticasone (FLONASE) 50 MCG/ACT nasal spray Place 2 sprays into both nostrils daily. 16 g 6  . letrozole (FEMARA) 2.5 MG tablet Take 1 tablet (2.5 mg total) by mouth daily. 5 tablet 3  . levocetirizine (XYZAL) 5 MG tablet Take 1 tablet (5 mg total) by mouth every evening. 90 tablet 1  . medroxyPROGESTERone (PROVERA) 10 MG tablet Take 1 tablet (10 mg total) by mouth daily. (Patient not taking: Reported on 03/28/2022) 10 tablet 3   No facility-administered medications prior to visit.    Allergies  Allergen Reactions  . Sulfa Antibiotics Rash      Review of Systems  Respiratory:  Negative for shortness of breath.   Cardiovascular:  Negative for chest pain and palpitations.  Gastrointestinal:  Negative for constipation and diarrhea.  Genitourinary:  Negative for dysuria, frequency and urgency.  Musculoskeletal:  Negative for myalgias.  Psychiatric/Behavioral:  Negative for depression and suicidal ideas.   All other systems reviewed and are negative.    Objective:    Physical Exam  Gen: NAD, resting comfortably HEENT: TMs normal bilaterally. OP clear. No thyromegaly noted.  CV: RRR with no murmurs appreciated Pulm: NWOB, CTAB with no crackles, wheezes, or rhonchi GI: Normal bowel sounds present. Soft, Nontender, Nondistended. MSK: no edema, cyanosis, or clubbing noted Skin: warm, dry Psych: Normal affect and thought content  BP 98/64   Pulse 92   Temp 98.6 F (37 C)   Resp 16   Ht '5\' 6"'$  (1.676 m)   Wt (!) 304 lb 2 oz (138 kg)   LMP 01/21/2022 (Exact Date)   SpO2 98%   BMI 49.09 kg/m  Wt Readings from Last 3 Encounters:  03/28/22 (!) 304 lb 2 oz (138 kg)  03/14/22 (!) 302 lb 12.8 oz (137.3 kg)  11/04/21 299 lb 3 oz (135.7 kg)     Health Maintenance Due  Topic Date Due  . HIV Screening  Never done  . Hepatitis C Screening  Never done  . INFLUENZA VACCINE   03/01/2022    There are no preventive care reminders to display for this patient.   Lab Results  Component Value Date   TSH 1.31 09/24/2021   Lab Results  Component Value Date   WBC 9.3 06/12/2021   HGB 13.7 06/12/2021   HCT 41.4 06/12/2021   MCV 86.3 06/12/2021   PLT 330 06/12/2021   Lab Results  Component Value Date   NA 139 06/12/2021   K 3.5 06/12/2021   CO2 27 06/12/2021   GLUCOSE 95  06/12/2021   BUN 10 06/12/2021   CREATININE 0.61 06/12/2021   BILITOT 0.3 07/02/2020   ALKPHOS 64 07/02/2020   AST 14 07/02/2020   ALT 15 07/02/2020   PROT 7.2 07/02/2020   ALBUMIN 4.4 07/02/2020   CALCIUM 9.5 06/12/2021   ANIONGAP 10 06/12/2021   No results found for: "CHOL" No results found for: "HDL" No results found for: "LDLCALC" No results found for: "TRIG" No results found for: "CHOLHDL" Lab Results  Component Value Date   HGBA1C 5.5 09/24/2021      Assessment & Plan:   Problem List Items Addressed This Visit       Respiratory   Seasonal allergic rhinitis due to pollen    Continue flonase         Endocrine   PCOS (polycystic ovarian syndrome)    Continue f/u with gyn as scheduled.         Other   Morbid obesity with BMI of 45.0-49.9, adult ([place])    Work on diet and exercise      Anxiety    Work on anxiety reducing techniques      ASCUS with positive high risk HPV cervical    F/u with gyn, repeat in one year with GYN as scheduled.       Patient desires pregnancy    Continue medication as prescribed.  Continue f/u with obgyn as scheduled.       Screening for lipoid disorders    Lipid panel ordered pending results.        Relevant Orders   Lipid panel   Encounter for general adult medical examination with abnormal findings - Primary    Patient Counseling(The following topics were reviewed):  Preventative care handout given to pt  Health maintenance and immunizations reviewed. Please refer to Health maintenance section. Pt advised on safe  sex, wearing seatbelts in car, and proper nutrition labwork ordered today for annual Dental health: Discussed importance of regular tooth brushing, flossing, and dental visits.        Relevant Orders   Comprehensive metabolic panel   Lipid panel   CBC with Differential/Platelet    No orders of the defined types were placed in this encounter.   Follow-up: Return in about 1 year (around 03/29/2023) for annual exam or as needed .    Eugenia Pancoast, FNP

## 2022-03-28 NOTE — Assessment & Plan Note (Signed)
Continue medication as prescribed.  Continue f/u with obgyn as scheduled.

## 2022-03-29 ENCOUNTER — Ambulatory Visit (INDEPENDENT_AMBULATORY_CARE_PROVIDER_SITE_OTHER): Payer: Medicaid Other | Admitting: Dermatology

## 2022-03-29 ENCOUNTER — Encounter: Payer: Self-pay | Admitting: Dermatology

## 2022-03-29 DIAGNOSIS — L988 Other specified disorders of the skin and subcutaneous tissue: Secondary | ICD-10-CM | POA: Diagnosis not present

## 2022-03-29 DIAGNOSIS — D485 Neoplasm of uncertain behavior of skin: Secondary | ICD-10-CM | POA: Diagnosis not present

## 2022-03-29 MED ORDER — MUPIROCIN 2 % EX OINT: 1.0000 | TOPICAL_OINTMENT | Freq: Every day | CUTANEOUS | 0 refills | Status: DC

## 2022-03-29 NOTE — Patient Instructions (Signed)

## 2022-03-29 NOTE — Progress Notes (Unsigned)
   Follow-Up Visit   Subjective  Susan Vazquez is a 21 y.o. 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  The following portions of the chart were reviewed this encounter and updated as appropriate:   Tobacco  Allergies  Meds  Problems  Med Hx  Surg Hx  Fam Hx     Review of Systems:  No other skin or systemic complaints except as noted in HPI or Assessment and Plan.  Objective  Well appearing patient in no apparent distress; mood and affect are within normal limits.  A focused examination was performed including left ear. Relevant physical exam findings are noted in the Assessment and Plan.  Left Ear mid to sup helix Healing biopsy site   Assessment & Plan  Neoplasm of uncertain behavior of skin Left Ear mid to sup helix  Skin excision  Total excision diameter (cm):  1.1 Informed consent: discussed and consent obtained   Timeout: patient name, date of birth, surgical site, and procedure verified   Procedure prep:  Patient was prepped and draped in usual sterile fashion Prep type:  Isopropyl alcohol and povidone-iodine Anesthesia: the lesion was anesthetized in a standard fashion   Anesthetic:  1% lidocaine w/ epinephrine 1-100,000 buffered w/ 8.4% NaHCO3 Instrument used: #15 blade   Hemostasis achieved with: pressure   Hemostasis achieved with comment:  Electrocautery Outcome: patient tolerated procedure well with no complications   Post-procedure details: sterile dressing applied and wound care instructions given   Dressing type: bandage and pressure dressing (mupirocin)    mupirocin ointment (BACTROBAN) 2 % Apply 1 Application topically daily. With dressing changes  Specimen 1 - Surgical pathology Differential Diagnosis: Biopsy proven severe dysplastic nevus Check Margins: Yes   Start Mupirocin oint qd to excision site   Return in about 1 week (around 04/05/2022) for  Follow up as scheduled and 1 year for TBSE.  I, Ashok Cordia, CMA, am acting as scribe for Sarina Ser, MD . Documentation: I have reviewed the above documentation for accuracy and completeness, and I agree with the above.  Sarina Ser, MD

## 2022-03-29 NOTE — Progress Notes (Signed)
Established Patient Office Visit  Subjective:  Patient ID: Susan Vazquez, female    DOB: [birth year]  Age: 21 y.o. MRN: [id number]  CC:  Chief Complaint  Patient presents with   Annual Exam    HPI Susan Vazquez is here today for an annual comprehensive exam.    TDAP: believes she had in 2021.   Flu vaccine: we do not yet have in the office.  Declines every being tested for HIV or hepatitis C.              No h/o drug use              No high risk activity history             Not baby boomer status Pt declines STD testing including HIV and hepatitis.    Pap 10/25/21, high risk HPV positive and ASCUS. Recheck in one year from 3/27.   Amenorrhea: started on provera with OBGYN Dr. Caron Presume 03/14/22. Also started on femara 2.5 mg daily. She desires to start a family. She has not yet started withdrawal bleed but was advised by GYN can take up to two weeks.    Dad with h/o melanoma, seen by dermatology. Has appt tomorrow for a few moles that were suspicious, will get them removed.   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/2021   10/29/21 repeat in 1 year per ASCCP guidelines    Atypical mole 03/16/2022   Left Ear mid to sup helix - severe, needs excision   Irregular periods 07/02/2020   Screening for diabetes mellitus 07/02/2020   Seasonal allergies    Weight gain 07/02/2020    Past Surgical History:  Procedure Laterality Date   ADENOIDECTOMY Bilateral 2007   TONSILLECTOMY Bilateral    2007   WISDOM TOOTH EXTRACTION      Family History  Problem Relation Age of Onset   Diabetes Mother    Melanoma Father        stage 3 melonoma cancer   Dementia Maternal Grandmother    Suicidality Maternal Grandfather     Social History   Socioeconomic History   Marital status: Significant Other    Spouse name: Not on file   Number of children: 0   Years of education: Not on file   Highest education level: Not on file   Occupational History   Occupation: [employer]    Employer: [employer]  Tobacco Use   Smoking status: Never   Smokeless tobacco: Never  Vaping Use   Vaping Use: Former  Substance and Sexual Activity   Alcohol use: Not Currently   Drug use: Never   Sexual activity: Yes    Partners: Male    Birth control/protection: Pill  Other Topics Concern   Not on file  Social History Narrative   Not on file   Social Determinants of Health   Financial Resource Strain: Low Risk  (10/25/2021)   Overall Financial Resource Strain (CARDIA)    Difficulty of Paying Living Expenses: Not hard at all  Food Insecurity: No Food Insecurity (10/25/2021)   Hunger Vital Sign    Worried About Running Out of Food in the Last Year: Never true    Ran Out of Food in the Last Year: Never true  Transportation Needs: No Transportation Needs (10/25/2021)   PRAPARE - Hydrologist (Medical): No    Lack of Transportation (Non-Medical): No  Physical Activity: Insufficiently Active (10/25/2021)   Exercise Vital Sign    Days of Exercise per Week: 3 days    Minutes of Exercise per Session: 20 min  Stress: No Stress Concern Present (10/25/2021)   [place]    Feeling of Stress : Only a little  Social Connections: Moderately Integrated (10/25/2021)   Social Connection and Isolation Panel [NHANES]    Frequency of Communication with Friends and Family: More than three times a week    Frequency of Social Gatherings with Friends and Family: Once a week    Attends Religious Services: 1 to 4 times per year    Active Member of [employer] or Organizations: No    Attends Archivist Meetings: Never    Marital Status: Living with partner  Intimate Partner Violence: Not At Risk (10/25/2021)   Humiliation, Afraid, Rape, and Kick questionnaire    Fear of Current or Ex-Partner: No    Emotionally Abused: No    Physically Abused: No     Sexually Abused: No    Outpatient Medications Prior to Visit  Medication Sig Dispense Refill   fluticasone (FLONASE) 50 MCG/ACT nasal spray Place 2 sprays into both nostrils daily. 16 g 6   letrozole (FEMARA) 2.5 MG tablet Take 1 tablet (2.5 mg total) by mouth daily. 5 tablet 3   levocetirizine (XYZAL) 5 MG tablet Take 1 tablet (5 mg total) by mouth every evening. 90 tablet 1   medroxyPROGESTERone (PROVERA) 10 MG tablet Take 1 tablet (10 mg total) by mouth daily. (Patient not taking: Reported on 03/28/2022) 10 tablet 3   No facility-administered medications prior to visit.    Allergies  Allergen Reactions   Sulfa Antibiotics Rash    ROS Review of Systems  Review of Systems  Respiratory:  Negative for shortness of breath.   Cardiovascular:  Negative for chest pain and palpitations.  Gastrointestinal:  Negative for constipation and diarrhea.  Genitourinary:  Negative for dysuria, frequency and urgency.  Musculoskeletal:  Negative for myalgias.  Psychiatric/Behavioral:  Negative for depression and suicidal ideas.   All other systems reviewed and are negative.    Objective:    Physical Exam  Gen: NAD, resting comfortably HEENT: TMs normal bilaterally. OP clear. No thyromegaly noted.  CV: RRR with no murmurs appreciated Pulm: NWOB, CTAB with no crackles, wheezes, or rhonchi GI: Normal bowel sounds present. Soft, Nontender, Nondistended. MSK: no edema, cyanosis, or clubbing noted Skin: warm, dry Neuro: CN2-12 grossly intact. Strength 5/5 in upper and lower extremities.  Psych: Normal affect and thought content  BP 98/64   Pulse 92   Temp 98.6 F (37 C)   Resp 16   Ht '5\' 6"'$  (1.676 m)   Wt (!) 304 lb 2 oz (138 kg)   LMP 01/21/2022 (Exact Date)   SpO2 98%   BMI 49.09 kg/m  Wt Readings from Last 3 Encounters:  03/28/22 (!) 304 lb 2 oz (138 kg)  03/14/22 (!) 302 lb 12.8 oz (137.3 kg)  11/04/21 299 lb 3 oz (135.7 kg)     Health Maintenance Due  Topic Date Due   HIV  Screening  Never done   Hepatitis C Screening  Never done   INFLUENZA VACCINE  03/01/2022    There are no preventive care reminders to display for this patient.  Lab Results  Component Value Date   TSH 1.31 09/24/2021   Lab Results  Component Value Date   WBC 8.7 03/28/2022  HGB 13.5 03/28/2022   HCT 39.4 03/28/2022   MCV 84.8 03/28/2022   PLT 278.0 03/28/2022   Lab Results  Component Value Date   NA 138 03/28/2022   K 4.5 03/28/2022   CO2 26 03/28/2022   GLUCOSE 85 03/28/2022   BUN 10 03/28/2022   CREATININE 0.61 03/28/2022   BILITOT 0.4 03/28/2022   ALKPHOS 67 03/28/2022   AST 16 03/28/2022   ALT 20 03/28/2022   PROT 7.1 03/28/2022   ALBUMIN 4.4 03/28/2022   CALCIUM 9.4 03/28/2022   ANIONGAP 10 06/12/2021   GFR 126.99 03/28/2022   Lab Results  Component Value Date   CHOL 138 03/28/2022   Lab Results  Component Value Date   HDL 37.30 (L) 03/28/2022   Lab Results  Component Value Date   LDLCALC 79 03/28/2022   Lab Results  Component Value Date   TRIG 105.0 03/28/2022   Lab Results  Component Value Date   CHOLHDL 4 03/28/2022   Lab Results  Component Value Date   HGBA1C 5.5 09/24/2021      Assessment & Plan:   Problem List Items Addressed This Visit       Respiratory   Seasonal allergic rhinitis due to pollen    Continue flonase         Endocrine   PCOS (polycystic ovarian syndrome)    Continue f/u with gyn as scheduled.         Other   Morbid obesity with BMI of 45.0-49.9, adult ([place])    Work on diet and exercise      Anxiety    Work on anxiety reducing techniques      ASCUS with positive high risk HPV cervical    F/u with gyn, repeat in one year with GYN as scheduled.       Patient desires pregnancy    Continue medication as prescribed.  Continue f/u with obgyn as scheduled.       Screening for lipoid disorders    Lipid panel ordered pending results.        Relevant Orders   Lipid panel (Completed)   Encounter  for general adult medical examination with abnormal findings - Primary    Patient Counseling(The following topics were reviewed):  Preventative care handout given to pt  Health maintenance and immunizations reviewed. Please refer to Health maintenance section. Pt advised on safe sex, wearing seatbelts in car, and proper nutrition labwork ordered today for annual Dental health: Discussed importance of regular tooth brushing, flossing, and dental visits.        Relevant Orders   Comprehensive metabolic panel (Completed)   Lipid panel (Completed)   CBC with Differential/Platelet (Completed)    No orders of the defined types were placed in this encounter.   Follow-up: Return in about 1 year (around 03/29/2023) for annual exam or as needed .    Eugenia Pancoast, FNP

## 2022-03-30 ENCOUNTER — Encounter: Payer: Self-pay | Admitting: Dermatology

## 2022-03-30 ENCOUNTER — Telehealth: Payer: Self-pay

## 2022-03-30 NOTE — Telephone Encounter (Signed)
-----   Message from Ralene Bathe, MD sent at 03/30/2022  4:23 PM EDT ----- Diagnosis Skin , left ear mid to sup helix NO RESIDUAL DYSPLASTIC NEVUS, MARGINS FREE  Site of Severe dysplastic Margins free

## 2022-03-30 NOTE — Telephone Encounter (Signed)
Advised Susan Vazquez of bx results.  Advised Susan Vazquez that Dr. Nehemiah Massed would recheck that site at her next appt./sh

## 2022-04-26 ENCOUNTER — Encounter: Payer: Medicaid Other | Admitting: Dermatology

## 2022-04-26 ENCOUNTER — Ambulatory Visit: Payer: Medicaid Other

## 2022-05-01 ENCOUNTER — Other Ambulatory Visit: Payer: Self-pay | Admitting: Family

## 2022-05-01 DIAGNOSIS — J301 Allergic rhinitis due to pollen: Secondary | ICD-10-CM

## 2022-05-02 ENCOUNTER — Telehealth: Payer: Self-pay | Admitting: Family

## 2022-05-02 ENCOUNTER — Ambulatory Visit (INDEPENDENT_AMBULATORY_CARE_PROVIDER_SITE_OTHER): Payer: Commercial Managed Care - HMO

## 2022-05-02 ENCOUNTER — Other Ambulatory Visit: Payer: Self-pay

## 2022-05-02 DIAGNOSIS — Z23 Encounter for immunization: Secondary | ICD-10-CM | POA: Diagnosis not present

## 2022-05-02 DIAGNOSIS — J301 Allergic rhinitis due to pollen: Secondary | ICD-10-CM

## 2022-05-02 MED ORDER — LEVOCETIRIZINE DIHYDROCHLORIDE 5 MG PO TABS: 5.0000 mg | ORAL_TABLET | Freq: Every evening | ORAL | 1 refills | Status: DC

## 2022-05-02 NOTE — Telephone Encounter (Signed)
Caller Name: emine lopata  Call back phone #: [phone]  MEDICATION(S):  levocetirizine (XYZAL) 5 MG tablet  Days of Med Remaining: 0  Has the patient contacted their pharmacy (YES/NO)? YES What did pharmacy advise?  Pharmacy stated they sent in pre request for re fill yesterday, 05/01/22 & we haven't responded   Preferred Pharmacy:  [facility], [address], [place]   ~~~Please advise patient/caregiver to allow 2-3 business days to process RX refills.

## 2022-05-02 NOTE — Telephone Encounter (Signed)
Patient called back stating that she's out of this medication ,and she needs it tonight.

## 2022-05-02 NOTE — Telephone Encounter (Signed)
Opened in error

## 2022-05-02 NOTE — Telephone Encounter (Signed)
This has been sent and also is available otc

## 2022-05-10 ENCOUNTER — Telehealth: Payer: Self-pay

## 2022-05-10 ENCOUNTER — Ambulatory Visit: Payer: Commercial Managed Care - HMO | Admitting: Dermatology

## 2022-05-10 DIAGNOSIS — L7211 Pilar cyst: Secondary | ICD-10-CM

## 2022-05-10 DIAGNOSIS — D492 Neoplasm of unspecified behavior of bone, soft tissue, and skin: Secondary | ICD-10-CM

## 2022-05-10 NOTE — Patient Instructions (Signed)

## 2022-05-10 NOTE — Progress Notes (Signed)
   Follow-Up Visit   Subjective  Susan Vazquez is a 21 y.o. female who presents for the following: Dermatofibroma vs cyst vs hemangioma (R buttock, pt presents for excision).  The following portions of the chart were reviewed this encounter and updated as appropriate:   Tobacco  Allergies  Meds  Problems  Med Hx  Surg Hx  Fam Hx     Review of Systems:  No other skin or systemic complaints except as noted in HPI or Assessment and Plan.  Objective  Well appearing patient in no apparent distress; mood and affect are within normal limits.  A focused examination was performed including buttocks. Relevant physical exam findings are noted in the Assessment and Plan.  Right buttock Firm papule 1.2cm   Assessment & Plan  Neoplasm of skin Right buttock  Skin excision  Lesion length (cm):  1.2 Lesion width (cm):  1.2 Margin per side (cm):  0 Total excision diameter (cm):  1.2 Informed consent: discussed and consent obtained   Timeout: patient name, date of birth, surgical site, and procedure verified   Procedure prep:  Patient was prepped and draped in usual sterile fashion Prep type:  Isopropyl alcohol and povidone-iodine Anesthesia: the lesion was anesthetized in a standard fashion   Anesthetic:  1% lidocaine w/ epinephrine 1-100,000 buffered w/ 8.4% NaHCO3 (6cc lido w/ epi, 3cc bupivicaine) Instrument used: #15 blade   Hemostasis achieved with: pressure   Hemostasis achieved with comment:  Electrocautery Outcome: patient tolerated procedure well with no complications   Post-procedure details: sterile dressing applied and wound care instructions given   Dressing type: bandage and pressure dressing (Mupirocin)    Skin repair Complexity:  Complex Final length (cm):  2.7 Reason for type of repair: reduce tension to allow closure, reduce the risk of dehiscence, infection, and necrosis, reduce subcutaneous dead space and avoid a hematoma, allow closure of the large defect,  preserve normal anatomy, preserve normal anatomical and functional relationships and enhance both functionality and cosmetic results   Undermining: area extensively undermined   Undermining comment:  Undermining Defect 1.2 Subcutaneous layers (deep stitches):  Suture size:  3-0 Suture type: Vicryl (polyglactin 910)   Subcutaneous suture technique: Inverted Dermal. Fine/surface layer approximation (top stitches):  Suture size:  3-0 Suture type: nylon   Stitches: simple running   Suture removal (days):  7 Hemostasis achieved with: pressure Outcome: patient tolerated procedure well with no complications   Post-procedure details: sterile dressing applied and wound care instructions given   Dressing type: bandage, pressure dressing and bacitracin (Mupirocin)    Specimen 1 - Surgical pathology Differential Diagnosis: D48.5 Dermatofibroma vs cyst vs hemangioma  Check Margins: No Firm papule 1.2cm  Dermatofibroma vs Cyst vs Hemangioma, excised today Start Mupirocin oint qd to excision site (pt has at home)   Return in about 1 week (around 05/17/2022) for suture removal.  I, Sonya Hupman, RMA, am acting as scribe for Sarina Ser, MD . Documentation: I have reviewed the above documentation for accuracy and completeness, and I agree with the above.  Sarina Ser, MD

## 2022-05-10 NOTE — Telephone Encounter (Signed)
Pt doing well after todays surgery./sh 

## 2022-05-17 ENCOUNTER — Encounter: Payer: Self-pay | Admitting: Dermatology

## 2022-05-17 ENCOUNTER — Ambulatory Visit (INDEPENDENT_AMBULATORY_CARE_PROVIDER_SITE_OTHER): Payer: Commercial Managed Care - HMO | Admitting: Dermatology

## 2022-05-17 DIAGNOSIS — L7211 Pilar cyst: Secondary | ICD-10-CM

## 2022-05-17 DIAGNOSIS — Z4802 Encounter for removal of sutures: Secondary | ICD-10-CM

## 2022-05-17 NOTE — Progress Notes (Signed)
   Follow-Up Visit   Subjective  Susan Vazquez is a 21 y.o. female who presents for the following: Post op/suture removal (Pilar cyst of the R buttock - patient is here today for suture removal).  The following portions of the chart were reviewed this encounter and updated as appropriate:   Tobacco  Allergies  Meds  Problems  Med Hx  Surg Hx  Fam Hx     Review of Systems:  No other skin or systemic complaints except as noted in HPI or Assessment and Plan.  Objective  Well appearing patient in no apparent distress; mood and affect are within normal limits.  A focused examination was performed including the buttocks. Relevant physical exam findings are noted in the Assessment and Plan.  R buttock Healing excision site.   Assessment & Plan  Pilar cyst R buttock  Encounter for Removal of Sutures - Incision site at the R buttock is clean, dry and intact - Wound cleansed, sutures removed, wound cleansed and steri strips applied.  - Discussed pathology results showing a benign pilar cyst.  - Patient advised to keep steri-strips dry until they fall off. - Scars remodel for a full year. - Once steri-strips fall off, patient can apply over-the-counter silicone scar cream each night to help with scar remodeling if desired. - Patient advised to call with any concerns or if they notice any new or changing lesions.    Return for appointment as scheduled.  Luther Redo, CMA, am acting as scribe for Sarina Ser, MD . Documentation: I have reviewed the above documentation for accuracy and completeness, and I agree with the above.  Sarina Ser, MD

## 2022-05-17 NOTE — Patient Instructions (Signed)
Due to recent changes in healthcare laws, you may see results of your pathology and/or laboratory studies on MyChart before the doctors have had a chance to review them. We understand that in some cases there may be results that are confusing or concerning to you. Please understand that not all results are received at the same time and often the doctors may need to interpret multiple results in order to provide you with the best plan of care or course of treatment. Therefore, we ask that you please give us 2 business days to thoroughly review all your results before contacting the office for clarification. Should we see a critical lab result, you will be contacted sooner.   If You Need Anything After Your Visit  If you have any questions or concerns for your doctor, please call our main line at [phone] and press option 4 to reach your doctor's medical assistant. If no one answers, please leave a voicemail as directed and we will return your call as soon as possible. Messages left after 4 pm will be answered the following business day.   You may also send us a message via MyChart. We typically respond to MyChart messages within 1-2 business days.  For prescription refills, please ask your pharmacy to contact our office. Our fax number is [phone].  If you have an urgent issue when the clinic is closed that cannot wait until the next business day, you can page your doctor at the number below.    Please note that while we do our best to be available for urgent issues outside of office hours, we are not available 24/7.   If you have an urgent issue and are unable to reach us, you may choose to seek medical care at your doctor's office, retail clinic, urgent care center, or emergency room.  If you have a medical emergency, please immediately call 911 or go to the emergency department.  Pager Numbers  - Dr. Kowalski: [phone]  - Dr. Moye: [phone]  - Dr. Stewart:  [phone]  In the event of inclement weather, please call our main line at [phone] for an update on the status of any delays or closures.  Dermatology Medication Tips: Please keep the boxes that topical medications come in in order to help keep track of the instructions about where and how to use these. Pharmacies typically print the medication instructions only on the boxes and not directly on the medication tubes.   If your medication is too expensive, please contact our office at [phone] option 4 or send us a message through MyChart.   We are unable to tell what your co-pay for medications will be in advance as this is different depending on your insurance coverage. However, we may be able to find a substitute medication at lower cost or fill out paperwork to get insurance to cover a needed medication.   If a prior authorization is required to get your medication covered by your insurance company, please allow us 1-2 business days to complete this process.  Drug prices often vary depending on where the prescription is filled and some pharmacies may offer cheaper prices.  The website www.goodrx.com contains coupons for medications through different pharmacies. The prices here do not account for what the cost may be with help from insurance (it may be cheaper with your insurance), but the website can give you the price if you did not use any insurance.  - You can print the associated coupon and take it with   your prescription to the pharmacy.  - You may also stop by our office during regular business hours and pick up a GoodRx coupon card.  - If you need your prescription sent electronically to a different pharmacy, notify our office through [facility] MyChart or by phone at [phone] option 4.     Si Usted Necesita Algo Despus de Su Visita  Tambin puede enviarnos un mensaje a travs de MyChart. Por lo general respondemos a los mensajes de MyChart en el transcurso de 1 a 2  das hbiles.  Para renovar recetas, por favor pida a su farmacia que se ponga en contacto con nuestra oficina. Nuestro nmero de fax es el [phone].  Si tiene un asunto urgente cuando la clnica est cerrada y que no puede esperar hasta el siguiente da hbil, puede llamar/localizar a su doctor(a) al nmero que aparece a continuacin.   Por favor, tenga en cuenta que aunque hacemos todo lo posible para estar disponibles para asuntos urgentes fuera del horario de oficina, no estamos disponibles las 24 horas del da, los 7 das de la semana.   Si tiene un problema urgente y no puede comunicarse con nosotros, puede optar por buscar atencin mdica  en el consultorio de su doctor(a), en una clnica privada, en un centro de atencin urgente o en una sala de emergencias.  Si tiene una emergencia mdica, por favor llame inmediatamente al 911 o vaya a la sala de emergencias.  Nmeros de bper  - Dr. Kowalski: [phone]  - Dra. Moye: [phone]  - Dra. Stewart: [phone]  En caso de inclemencias del tiempo, por favor llame a nuestra lnea principal al [phone] para una actualizacin sobre el estado de cualquier retraso o cierre.  Consejos para la medicacin en dermatologa: Por favor, guarde las cajas en las que vienen los medicamentos de uso tpico para ayudarle a seguir las instrucciones sobre dnde y cmo usarlos. Las farmacias generalmente imprimen las instrucciones del medicamento slo en las cajas y no directamente en los tubos del medicamento.   Si su medicamento es muy caro, por favor, pngase en contacto con nuestra oficina llamando al [phone] y presione la opcin 4 o envenos un mensaje a travs de MyChart.   No podemos decirle cul ser su copago por los medicamentos por adelantado ya que esto es diferente dependiendo de la cobertura de su seguro. Sin embargo, es posible que podamos encontrar un medicamento sustituto a menor costo o llenar un formulario para que el  seguro cubra el medicamento que se considera necesario.   Si se requiere una autorizacin previa para que su compaa de seguros cubra su medicamento, por favor permtanos de 1 a 2 das hbiles para completar este proceso.  Los precios de los medicamentos varan con frecuencia dependiendo del lugar de dnde se surte la receta y alguna farmacias pueden ofrecer precios ms baratos.  El sitio web www.goodrx.com tiene cupones para medicamentos de diferentes farmacias. Los precios aqu no tienen en cuenta lo que podra costar con la ayuda del seguro (puede ser ms barato con su seguro), pero el sitio web puede darle el precio si no utiliz ningn seguro.  - Puede imprimir el cupn correspondiente y llevarlo con su receta a la farmacia.  - Tambin puede pasar por nuestra oficina durante el horario de atencin regular y recoger una tarjeta de cupones de GoodRx.  - Si necesita que su receta se enve electrnicamente a una farmacia diferente, informe a nuestra oficina a travs de MyChart de [place]   o por telfono llamando al [phone] y presione la opcin 4.  

## 2022-05-27 ENCOUNTER — Encounter: Payer: Self-pay | Admitting: Dermatology

## 2022-06-24 ENCOUNTER — Encounter: Payer: Self-pay | Admitting: Obstetrics and Gynecology

## 2022-06-27 ENCOUNTER — Ambulatory Visit (INDEPENDENT_AMBULATORY_CARE_PROVIDER_SITE_OTHER): Payer: Commercial Managed Care - HMO

## 2022-06-27 DIAGNOSIS — Z3201 Encounter for pregnancy test, result positive: Secondary | ICD-10-CM | POA: Diagnosis not present

## 2022-06-27 DIAGNOSIS — Z3401 Encounter for supervision of normal first pregnancy, first trimester: Secondary | ICD-10-CM

## 2022-06-27 LAB — POCT PREGNANCY, URINE: Preg Test, Ur: POSITIVE — AB

## 2022-06-27 MED ORDER — PREPLUS 27-1 MG PO TABS: 1.0000 | ORAL_TABLET | Freq: Every day | ORAL | 11 refills | Status: DC

## 2022-06-27 NOTE — Progress Notes (Signed)
Possible Pregnancy  Patient came into office and left urine sample to be tested; called patient via phone upon UPT result.  UPT in office today is positive. Pt reports first positive home UPT on 06/24/22. Reviewed dating with patient:   LMP: 05/26/22; irregular periods before pregnancy, 30-60 days apart, 1-2 days light bleeding.  EDD: 03/02/2023 4w 4d today Patient Hx of PCOS and fertility treatments.   Patient would like to have prenatal care with [facility].   OB history reviewed. Reviewed medications and allergies with patient; list of medications safe to take during pregnancy given.  Recommended pt begin prenatal vitamin and schedule prenatal care.  Alinda Money, RN 06/27/2022  2:12 PM

## 2022-06-29 ENCOUNTER — Encounter: Payer: Self-pay | Admitting: Advanced Practice Midwife

## 2022-06-29 ENCOUNTER — Encounter: Payer: Self-pay | Admitting: Family

## 2022-07-01 DIAGNOSIS — Z419 Encounter for procedure for purposes other than remedying health state, unspecified: Secondary | ICD-10-CM | POA: Diagnosis not present

## 2022-07-10 ENCOUNTER — Inpatient Hospital Stay (HOSPITAL_COMMUNITY)
Admission: AD | Admit: 2022-07-10 | Discharge: 2022-07-10 | Disposition: A | Discharge status: Home / Self Care | Payer: Commercial Managed Care - HMO | Attending: Obstetrics & Gynecology | Admitting: Obstetrics & Gynecology

## 2022-07-10 ENCOUNTER — Encounter (HOSPITAL_COMMUNITY): Payer: Self-pay | Admitting: Obstetrics & Gynecology

## 2022-07-10 ENCOUNTER — Inpatient Hospital Stay (HOSPITAL_COMMUNITY): Payer: Commercial Managed Care - HMO

## 2022-07-10 ENCOUNTER — Encounter: Payer: Self-pay | Admitting: Advanced Practice Midwife

## 2022-07-10 ENCOUNTER — Other Ambulatory Visit: Payer: Self-pay

## 2022-07-10 DIAGNOSIS — O200 Threatened abortion: Secondary | ICD-10-CM | POA: Insufficient documentation

## 2022-07-10 DIAGNOSIS — Z3A01 Less than 8 weeks gestation of pregnancy: Secondary | ICD-10-CM | POA: Diagnosis not present

## 2022-07-10 DIAGNOSIS — Z79899 Other long term (current) drug therapy: Secondary | ICD-10-CM | POA: Insufficient documentation

## 2022-07-10 LAB — WET PREP, GENITAL
Clue Cells Wet Prep HPF POC: NONE SEEN
Sperm: NONE SEEN
Trich, Wet Prep: NONE SEEN
WBC, Wet Prep HPF POC: <10 (ref <10)
Yeast Wet Prep HPF POC: NONE SEEN

## 2022-07-10 LAB — CBC
HCT: 39.8 % (ref 36.0–46.0)
Hemoglobin: 13.3 g/dL (ref 12.0–15.0)
MCH: 28.7 pg (ref 26.0–34.0)
MCHC: 33.4 g/dL (ref 30.0–36.0)
MCV: 85.8 fL (ref 80.0–100.0)
Platelets: 295 10*3/uL (ref 150–400)
RBC: 4.64 MIL/uL (ref 3.87–5.11)
RDW: 12.8 % (ref 11.5–15.5)
WBC: 10.6 10*3/uL — ABNORMAL HIGH (ref 4.0–10.5)
nRBC: 0 % (ref 0.0–0.2)

## 2022-07-10 LAB — URINALYSIS, ROUTINE W REFLEX MICROSCOPIC
Bilirubin Urine: NEGATIVE
Glucose, UA: NEGATIVE mg/dL
Ketones, ur: NEGATIVE mg/dL
Nitrite: NEGATIVE
Protein, ur: 30 mg/dL — AB
RBC / HPF: >50 RBC/hpf — ABNORMAL HIGH (ref 0–5)
Specific Gravity, Urine: 1.013 (ref 1.005–1.030)
pH: 7 (ref 5.0–8.0)

## 2022-07-10 LAB — HCG, QUANTITATIVE, PREGNANCY: hCG, Beta Chain, Quant, S: 100 m[IU]/mL — ABNORMAL HIGH (ref <5)

## 2022-07-10 LAB — HIV ANTIBODY (ROUTINE TESTING W REFLEX): HIV Screen 4th Generation wRfx: NONREACTIVE

## 2022-07-10 NOTE — MAU Note (Signed)
Susan Vazquez is a 21 y.o. at 44w3dhere in MAU reporting: yesterday saw some brown discharge, this AM it was a little bit more red and it has gotten heavier. Put a pad on when using the bathroom in MAU. No clots. Having some cramping.   LMP: 05/26/2022  Onset of complaint: yesterday  Pain score: 5/10  Vitals:   07/10/22 1324  BP: 128/81  Pulse: 86  Resp: 16  Temp: 98.8 F (37.1 C)  SpO2: 99%     FHT:NA  Lab orders placed from triage: UA

## 2022-07-10 NOTE — MAU Provider Note (Signed)
Chief Complaint: Abdominal Pain and Vaginal Bleeding   Event Date/Time   First Provider Initiated Contact with Patient 07/10/22 1423     SUBJECTIVE HPI: Susan Vazquez is a 21 y.o. G1P0000 at 56w3dwho presents to Maternity Admissions reporting vaginal bleeding since yesterday and cramping since this morning.  Positive home office UPT.  No other testing this pregnancy  Vaginal Bleeding: Dark spotting yesterday that has increased to moderate bright red bleeding today. Passage of tissue or clots: Denies Dizziness: Denies  [facility]formed [facility] [address] [place] [address]  Pain Location: Lower abdomen Quality: Cramping Severity: 3/10 on pain scale Duration: Less than 24 hours Course: Unchanged Context: Positive pregnancy test.  IUP not confirmed Timing: Intermittent Modifying factors: Nothing.  Has not tried anything for the pain. Associated signs and symptoms: Positive for vaginal bleeding.  Negative for fever, chills, GI complaints, urinary complaints, vaginal discharge.  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   Irregular periods 07/02/2020   PCOS (polycystic ovarian syndrome)    Screening for diabetes mellitus 07/02/2020   Seasonal allergies    Weight gain 07/02/2020   OB History  Gravida Para Term Preterm AB Living  1 0 0 0 0 0  SAB IAB Ectopic Multiple Live Births  0 0 0 0 0    # Outcome Date GA Lbr Len/2nd Weight Sex Delivery Anes PTL Lv  1 Current            Past Surgical History:  Procedure Laterality Date   ADENOIDECTOMY Bilateral 2007   TONSILLECTOMY Bilateral    2007   WISDOM TOOTH EXTRACTION     Social History   Socioeconomic History   Marital status: Significant Other    Spouse name: Not on file   Number of children: 0   Years of  education: Not on file   Highest education level: Not on file  Occupational History   Occupation: [employer]    Employer: [employer]  Tobacco Use   Smoking status: Never   Smokeless tobacco: Never  Vaping Use   Vaping Use: Former  Substance and Sexual Activity   Alcohol use: Not Currently   Drug use: Never   Sexual activity: Not Currently    Partners: Male  Other Topics Concern   Not on file  Social History Narrative   Not on file   Social Determinants of Health   Financial Resource Strain: Low Risk  (10/25/2021)   Overall Financial Resource Strain (CARDIA)    Difficulty of Paying Living Expenses: Not hard at all  Food Insecurity: No Food Insecurity (10/25/2021)   Hunger Vital Sign    Worried About Running Out of Food in the Last Year: Never true    Ran Out of Food in the Last Year: Never true  Transportation Needs: No Transportation Needs (10/25/2021)   PRAPARE - THydrologist(Medical): No    Lack of Transportation (Non-Medical): No  Physical Activity: Insufficiently Active (10/25/2021)   Exercise Vital Sign    Days of Exercise per Week: 3 days    Minutes of Exercise per Session: 20 min  Stress: No Stress Concern Present (10/25/2021)   [place]  Feeling of Stress : Only a little  Social Connections: Moderately Integrated (10/25/2021)   Social Connection and Isolation Panel [NHANES]    Frequency of Communication with Friends and Family: More than three times a week    Frequency of Social Gatherings with Friends and Family: Once a week    Attends Religious Services: 1 to 4 times per year    Active Member of [employer] or Organizations: No    Attends Archivist Meetings: Never    Marital Status: Living with partner  Intimate Partner Violence: Not At Risk (10/25/2021)   Humiliation, Afraid, Rape, and Kick questionnaire    Fear of Current or Ex-Partner: No    Emotionally  Abused: No    Physically Abused: No    Sexually Abused: No   No current facility-administered medications on file prior to encounter.   Current Outpatient Medications on File Prior to Encounter  Medication Sig Dispense Refill   fluticasone (FLONASE) 50 MCG/ACT nasal spray Place 2 sprays into both nostrils daily. 16 g 6   levocetirizine (XYZAL) 5 MG tablet Take 1 tablet (5 mg total) by mouth every evening. 90 tablet 1   Prenatal Vit-Fe Fumarate-FA (PREPLUS) 27-1 MG TABS Take 1 tablet by mouth daily. 30 tablet 11   albuterol (PROAIR HFA) 108 (90 Base) MCG/ACT inhaler INHALE 2 PUFFS BY MOUTH EVERY 4 TO 6 HOURS AS NEEDED (USE WITH SPACER)     ibuprofen (ADVIL) 800 MG tablet Take 1 tablet 3 times a day by oral route for 14 days. (Patient not taking: Reported on 06/27/2022)     letrozole (FEMARA) 2.5 MG tablet Take 1 tablet (2.5 mg total) by mouth daily. (Patient not taking: Reported on 06/27/2022) 5 tablet 3   loratadine (CLARITIN) 10 MG tablet TAKE 1 TABLET BY MOUTH ONCE DAILY FOR ALLERGIES     medroxyPROGESTERone (PROVERA) 10 MG tablet Take 10 mg by mouth daily.     mupirocin ointment (BACTROBAN) 2 % Apply 1 Application topically daily. With dressing changes (Patient not taking: Reported on 06/27/2022) 22 g 0   [DISCONTINUED] cetirizine (ZYRTEC) 10 MG tablet Take 10 mg by mouth daily.       Allergies  Allergen Reactions   Sulfa Antibiotics Rash    I have reviewed the past Medical Hx, Surgical Hx, Social Hx, Allergies and Medications.   Review of Systems  Constitutional:  Negative for chills and fever.  Gastrointestinal:  Positive for abdominal pain. Negative for constipation, diarrhea, nausea and vomiting.  Genitourinary:  Positive for vaginal bleeding. Negative for dysuria, frequency, hematuria, urgency and vaginal discharge.  Neurological:  Negative for dizziness.    OBJECTIVE Patient Vitals for the past 24 hrs:  BP Temp Temp src Pulse Resp SpO2 Height Weight  07/10/22 1342 122/74  -- -- 89 -- -- -- --  07/10/22 1324 128/81 98.8 F (37.1 C) Oral 86 16 99 % -- --  07/10/22 1322 -- -- -- -- -- -- '5\' 7"'$  (1.702 m) 134 kg   Constitutional: Well-developed, well-nourished female in no acute distress.  Cardiovascular: normal rate Respiratory: normal rate and effort.  GI: Abd soft, non-tender.  MS: Extremities nontender, no edema, normal ROM Neurologic: Alert and oriented x 4.  GU: Neg CVAT.  SPECULUM EXAM: NEFG, physiologic discharge, small-mod amount of bright red blood noted, small fragment of possible tissue protruding through cervix. Collected to be sent to pathology. Cervix clean, closed, non-friable.  BIMANUAL: cervix closed; uterus not obviously enlarged. No adnexal tenderness or masses. No CMT.  LAB RESULTS Results for orders placed or performed during the hospital encounter of 07/10/22 (from the past 24 hour(s))  Urinalysis, Routine w reflex microscopic Urine, Clean Catch     Status: Abnormal   Collection Time: 07/10/22  1:35 PM  Result Value Ref Range   Color, Urine YELLOW YELLOW   APPearance HAZY (A) CLEAR   Specific Gravity, Urine 1.013 1.005 - 1.030   pH 7.0 5.0 - 8.0   Glucose, UA NEGATIVE NEGATIVE mg/dL   Hgb urine dipstick LARGE (A) NEGATIVE   Bilirubin Urine NEGATIVE NEGATIVE   Ketones, ur NEGATIVE NEGATIVE mg/dL   Protein, ur 30 (A) NEGATIVE mg/dL   Nitrite NEGATIVE NEGATIVE   Leukocytes,Ua TRACE (A) NEGATIVE   RBC / HPF >50 (H) 0 - 5 RBC/hpf   WBC, UA 0-5 0 - 5 WBC/hpf   Bacteria, UA RARE (A) NONE SEEN  Wet prep, genital     Status: None   Collection Time: 07/10/22  1:47 PM   Specimen: Vaginal  Result Value Ref Range   Yeast Wet Prep HPF POC NONE SEEN NONE SEEN   Trich, Wet Prep NONE SEEN NONE SEEN   Clue Cells Wet Prep HPF POC NONE SEEN NONE SEEN   WBC, Wet Prep HPF POC <10 <10   Sperm NONE SEEN   ABO/Rh     Status: None   Collection Time: 07/10/22  1:55 PM  Result Value Ref Range   ABO/RH(D)      A POS Performed at [facility], [address], [place], [place] [address]   hCG, quantitative, pregnancy     Status: Abnormal   Collection Time: 07/10/22  1:58 PM  Result Value Ref Range   hCG, Beta Chain, Quant, S 100 (H) <5 mIU/mL  CBC     Status: Abnormal   Collection Time: 07/10/22  1:58 PM  Result Value Ref Range   WBC 10.6 (H) 4.0 - 10.5 K/uL   RBC 4.64 3.87 - 5.11 MIL/uL   Hemoglobin 13.3 12.0 - 15.0 g/dL   HCT 39.8 36.0 - 46.0 %   MCV 85.8 80.0 - 100.0 fL   MCH 28.7 26.0 - 34.0 pg   MCHC 33.4 30.0 - 36.0 g/dL   RDW 12.8 11.5 - 15.5 %   Platelets 295 150 - 400 K/uL   nRBC 0.0 0.0 - 0.2 %    IMAGING US OB LESS THAN 14 WEEKS WITH OB TRANSVAGINAL  Result Date: 07/10/2022 CLINICAL DATA:  Vaginal bleeding in pregnancy. Patient 6 weeks and 3 days pregnant based on her last menstrual period. EXAM: OBSTETRIC <14 WK [place] AND TRANSVAGINAL OB US TECHNIQUE: Both transabdominal and transvaginal ultrasound examinations were performed for complete evaluation of the gestation as well as the maternal uterus, adnexal regions, and pelvic cul-de-sac. Transvaginal technique was performed to assess early pregnancy. COMPARISON:  None Available. FINDINGS: Intrauterine gestational sac: None Yolk sac:  Not Visualized. Embryo:  Not Visualized. Maternal uterus/adnexae: No uterine masses. Small subtle cystic area suggested along the endometrium of the upper uterine segment. Cervix unremarkable. Right ovary not well visualized, grossly unremarkable. Normal left ovary. No adnexal masses. Small amount of pelvic free fluid, presumed physiologic. IMPRESSION: 1. No visualized intrauterine or ectopic pregnancy. No acute abnormality. Findings may reflect a pregnancy that is too early to visualize, supported by the low beta HCG level of 100. Recommend follow-up beta HCG level and, if rising, repeat pelvic ultrasound in 10-14 days to assess for normal pregnancy progression. Electronically Signed  By: Lajean Manes M.D.   On: 07/10/2022 15:31     MAU COURSE Orders Placed This Encounter  Procedures   Wet prep, genital   Culture, OB Urine   US OB LESS THAN 14 WEEKS WITH OB TRANSVAGINAL   Urinalysis, Routine w reflex microscopic Urine, Clean Catch   hCG, quantitative, pregnancy   CBC   HIV Antibody (routine testing w rflx)   RPR   ABO/Rh   Discharge patient   No orders of the defined types were placed in this encounter.    MDM Pain and bleeding in early pregnancy with pregnancy of unknown anatomic location, but hemodynamically stable. Concern for SAB due to passage of possible tissue.   ASSESSMENT 1. Threatened miscarriage     PLAN Discharge home in stable condition. SAB and Ectopic precautions  Follow-up [place] [facility] at [facility] Follow up on 07/12/2022.   Specialty: Obstetrics and Gynecology Why: at 3:30 for repeat hCG (pregnancy hormon) level. If you cannot make it to this appointment, go to MAU same day for labs work. Contact information: [address] [place] [address] [phone]        [facility] Follow up.   Specialty: Obstetrics and Gynecology Why: As needed in emergencies Contact information: [address] [id number] [place] [phone]               Allergies as of 07/10/2022       Reactions   Sulfa Antibiotics Rash        Medication List     STOP taking these medications    fluticasone 50 MCG/ACT nasal spray Commonly known as: FLONASE   ibuprofen 800 MG tablet Commonly known as: ADVIL   letrozole 2.5 MG tablet Commonly known as: Femara   loratadine 10 MG tablet Commonly known as: CLARITIN   medroxyPROGESTERone 10 MG tablet Commonly known as: PROVERA   mupirocin ointment 2 % Commonly known as: BACTROBAN   ProAir HFA 108 (90 Base) MCG/ACT inhaler Generic drug: albuterol       TAKE these medications    levocetirizine 5  MG tablet Commonly known as: XYZAL Take 1 tablet (5 mg total) by mouth every evening.   PrePLUS 27-1 MG Tabs Take 1 tablet by mouth daily.         Tamala Julian, [place], [place] 07/10/2022  4:28 PM  4

## 2022-07-11 ENCOUNTER — Other Ambulatory Visit: Payer: Self-pay

## 2022-07-11 DIAGNOSIS — O200 Threatened abortion: Secondary | ICD-10-CM

## 2022-07-11 LAB — CULTURE, OB URINE: Culture: NO GROWTH

## 2022-07-11 LAB — GC/CHLAMYDIA PROBE AMP (~~LOC~~) NOT AT ARMC
Chlamydia: NEGATIVE
Comment: NEGATIVE
Comment: NORMAL
Neisseria Gonorrhea: NEGATIVE

## 2022-07-11 LAB — ABO/RH: ABO/RH(D): A POS

## 2022-07-11 LAB — RPR: RPR Ser Ql: NONREACTIVE

## 2022-07-12 ENCOUNTER — Other Ambulatory Visit: Payer: Medicaid Other

## 2022-07-12 DIAGNOSIS — O200 Threatened abortion: Secondary | ICD-10-CM

## 2022-07-12 LAB — SURGICAL PATHOLOGY

## 2022-07-13 LAB — BETA HCG QUANT (REF LAB): hCG Quant: 7 m[IU]/mL

## 2022-07-14 ENCOUNTER — Encounter: Payer: Self-pay | Admitting: Advanced Practice Midwife

## 2022-07-18 ENCOUNTER — Ambulatory Visit (INDEPENDENT_AMBULATORY_CARE_PROVIDER_SITE_OTHER): Payer: Commercial Managed Care - HMO | Admitting: Medical

## 2022-07-18 ENCOUNTER — Encounter: Payer: Self-pay | Admitting: Medical

## 2022-07-18 VITALS — BP 125/70 | HR 84 | Wt 294.0 lb

## 2022-07-18 DIAGNOSIS — E282 Polycystic ovarian syndrome: Secondary | ICD-10-CM

## 2022-07-18 DIAGNOSIS — Z319 Encounter for procreative management, unspecified: Secondary | ICD-10-CM | POA: Diagnosis not present

## 2022-07-18 DIAGNOSIS — Z3A00 Weeks of gestation of pregnancy not specified: Secondary | ICD-10-CM

## 2022-07-18 DIAGNOSIS — O039 Complete or unspecified spontaneous abortion without complication: Secondary | ICD-10-CM

## 2022-07-18 MED ORDER — LETROZOLE 2.5 MG PO TABS: ORAL_TABLET | ORAL | 2 refills | Status: DC

## 2022-07-18 NOTE — Progress Notes (Signed)
   History:  Ms. Susan Vazquez is a 21 y.o. G1P0000 who presents to clinic today for follow-up after SAB. Patient had +UPT 06/27/22 in office. On 07/10/22 she started to have bleeding and went to MAU. [place] that day showed no IUP, follow-up hCG on 12/12 was 7 and no further testing is recommended. She states heavy bleeding x 3 days following MAU visit. She is still spotting a little bit today. She denies cramping or fever.  She has a history of PCOS and was using Provera and Femara to conceive this pregnancy. She would like to try again as soon as it is safe to do so.   The following portions of the patient's history were reviewed and updated as appropriate: allergies, current medications, family history, past medical history, social history, past surgical history and problem list.  Review of Systems:  Review of Systems  Constitutional:  Negative for fever and malaise/fatigue.  Gastrointestinal:  Negative for abdominal pain, constipation, diarrhea, nausea and vomiting.  Genitourinary:  Negative for dysuria, frequency and urgency.       Neg - discharge, pelvic pain + vaginal bleeding (spotting)     Objective:  Physical Exam BP 125/70   Pulse 84   Wt 294 lb (133.4 kg)   LMP 05/26/2022   Breastfeeding Unknown   BMI 46.05 kg/m  Physical Exam Vitals and nursing note reviewed.  Constitutional:      General: She is not in acute distress.    Appearance: Normal appearance. She is well-developed. She is obese.  HENT:     Head: Normocephalic and atraumatic.  Neck:     Thyroid: No thyromegaly.  Cardiovascular:     Rate and Rhythm: Normal rate.  Pulmonary:     Effort: Pulmonary effort is normal.  Abdominal:     General: Abdomen is flat. There is no distension.     Palpations: Abdomen is soft.  Musculoskeletal:     Cervical back: Neck supple.  Skin:    General: Skin is warm and dry.     Findings: No erythema.  Neurological:     Mental Status: She is alert and oriented to person, place,  and time.  Psychiatric:        Mood and Affect: Mood normal.     Health Maintenance Due  Topic Date Due   COVID-19 Vaccine (1) Never done   Hepatitis C Screening  Never done    Labs, imaging and previous visits in Epic reviewed  Assessment & Plan:  1. PCOS (polycystic ovarian syndrome) - letrozole (FEMARA) 2.5 MG tablet; Take 1 tab (2.5 mg) daily on cycle day 3-7  Dispense: 5 tablet; Refill: 2  2. Patient desires pregnancy -  discussed waiting for one period to attempt to conceive - Start taking prenatal vitamins  - Femara on day 3-7 of cycle, if no success in 3 month, return to clinic for referral - If no spontaneous period in 3-4 weeks, will send Provera to induce cycle - Advised how to use OTC ovulation kits   3. SAB (spontaneous abortion) - This was first pregnancy  Approximately 20 minutes of total time was spent with this patient on chart review, history taking, patient education, physical exam and documentation  Danielle Rankin 07/18/2022 3:29 PM

## 2022-07-20 ENCOUNTER — Ambulatory Visit: Payer: Commercial Managed Care - HMO | Admitting: Obstetrics and Gynecology

## 2022-07-26 IMAGING — CT CT NECK W/ CM
3 of 4 series · 13 of 35 positions shown, 16 images · IV contrast (omnipaque)
Comparison: None.

CLINICAL DATA: Maxillofacial pain

EXAM:
CT NECK WITH CONTRAST
TECHNIQUE: Multidetector CT imaging of the neck was performed using the
standard protocol following the bolus administration of intravenous
contrast.
CONTRAST:  75mL OMNIPAQUE IOHEXOL 300 MG/ML  SOLN

[Series 2: axial neck · axial · 0.46mm/px · z∈[-251,-97]mm · 5 of 117 slices shown, 7 images]
[im 20/117  soft-tissue]
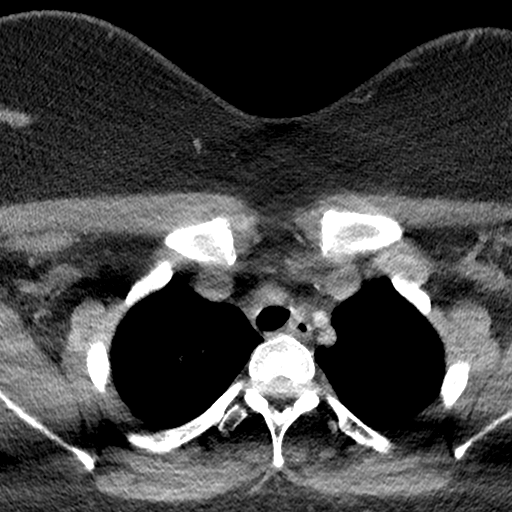
[im 20/117  bone]
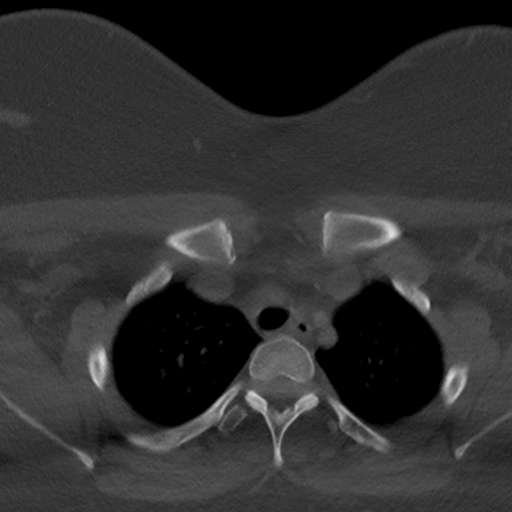
[im 39/117  bone]
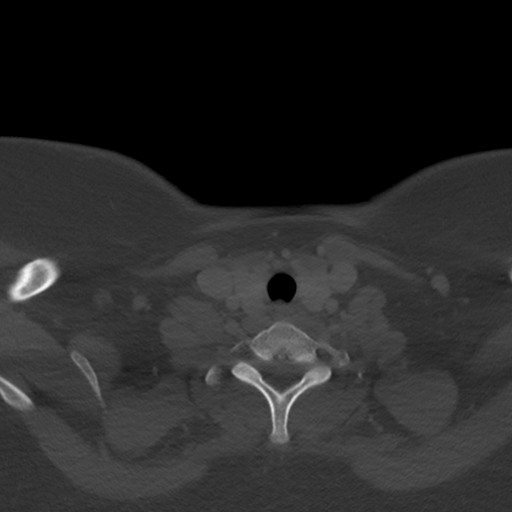
[im 59/117  bone]
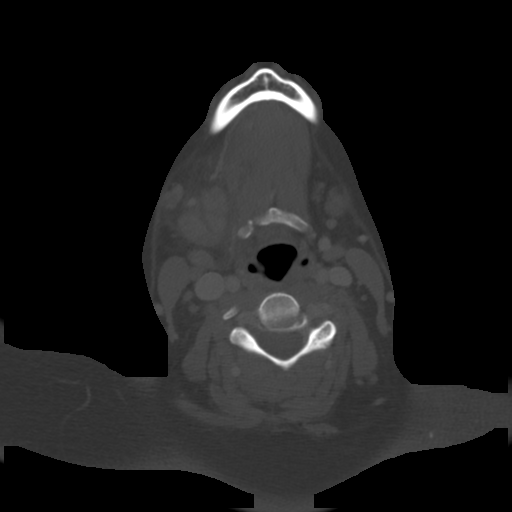
[im 78/117  bone]
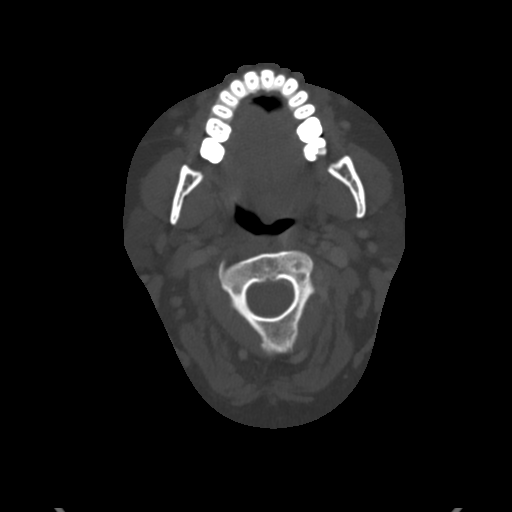
[im 97/117  soft-tissue]
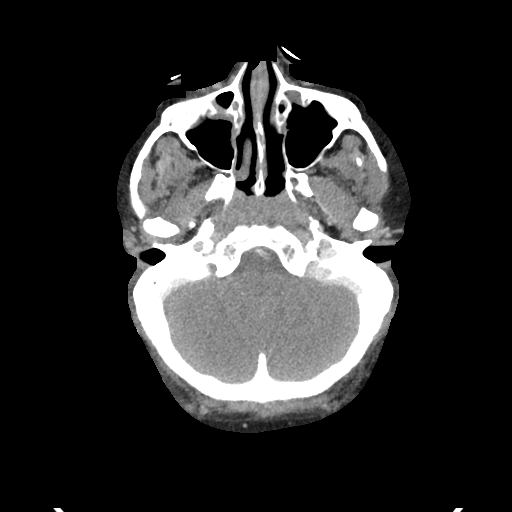
[im 97/117  bone]
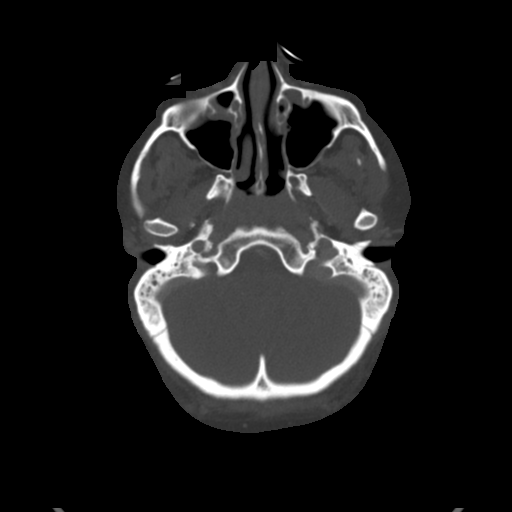

[Series 5: sag neck · sagittal · 0.49mm/px · 5 of 106 slices shown, 6 images]
[im 36/106  bone]
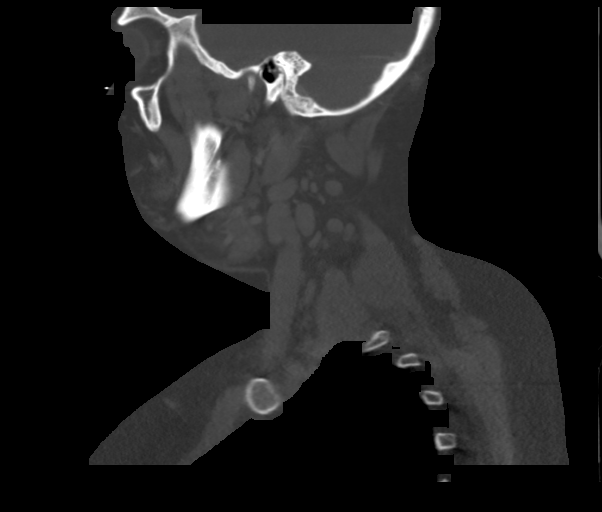
[im 44/106  bone]
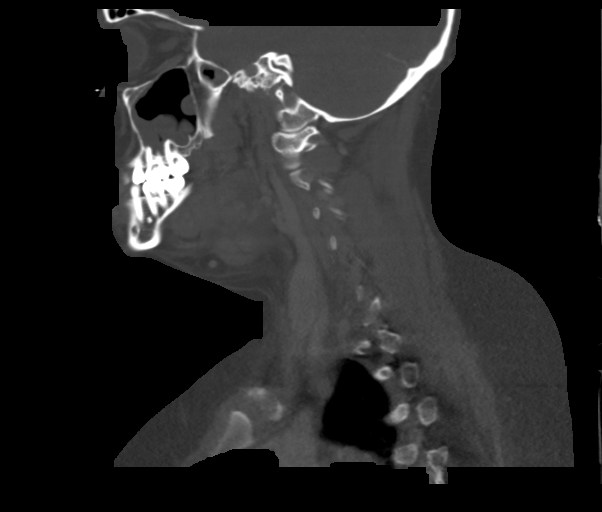
[im 53/106  soft-tissue]
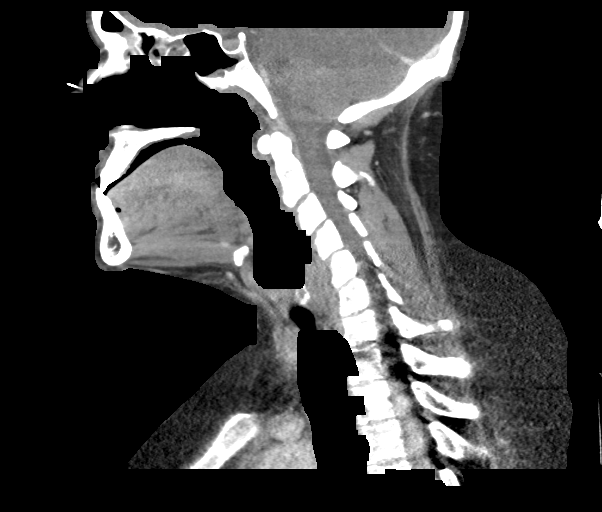
[im 53/106  bone]
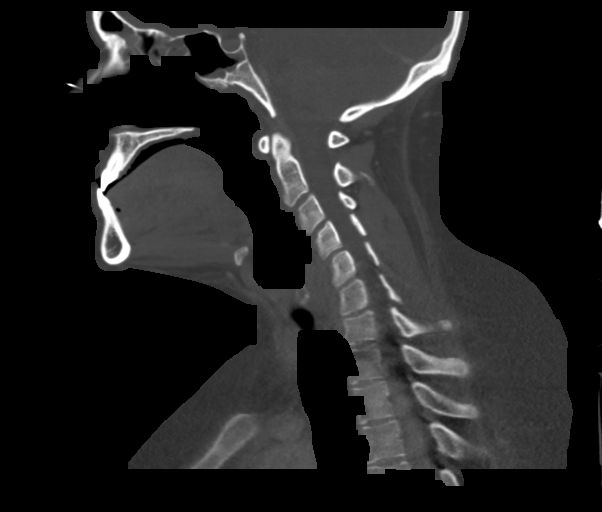
[im 62/106  bone]
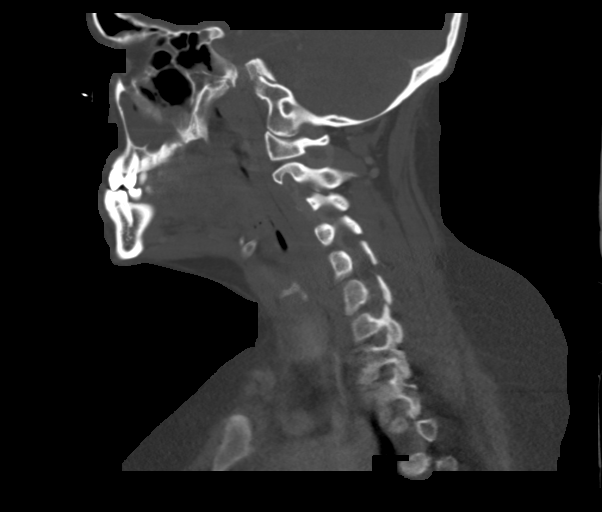
[im 71/106  bone]
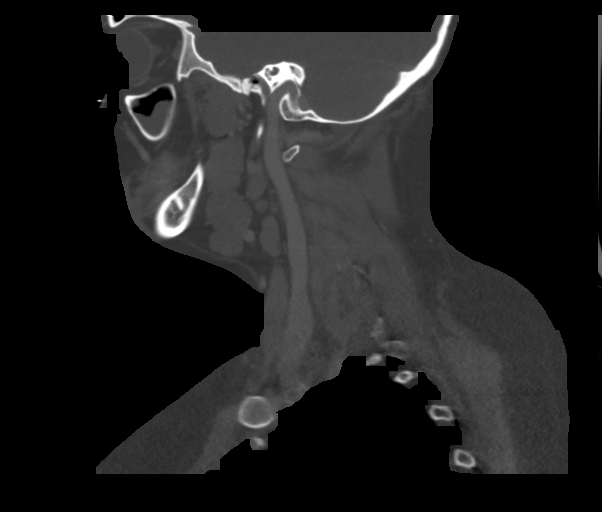

[Series 6: cor neck · coronal · 0.43mm/px · 3 of 126 slices shown]
[im 26/126  bone]
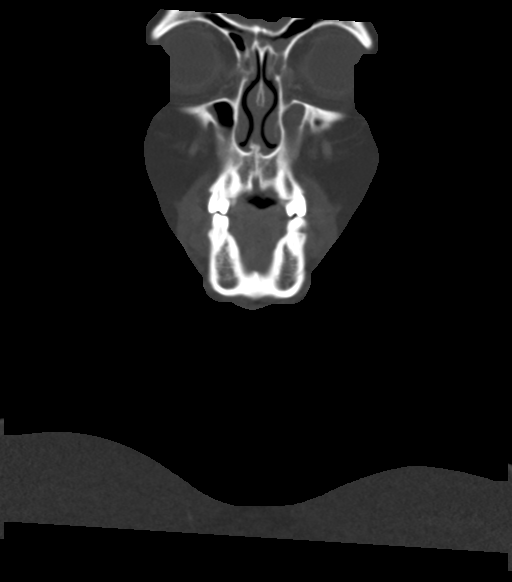
[im 51/126  bone]
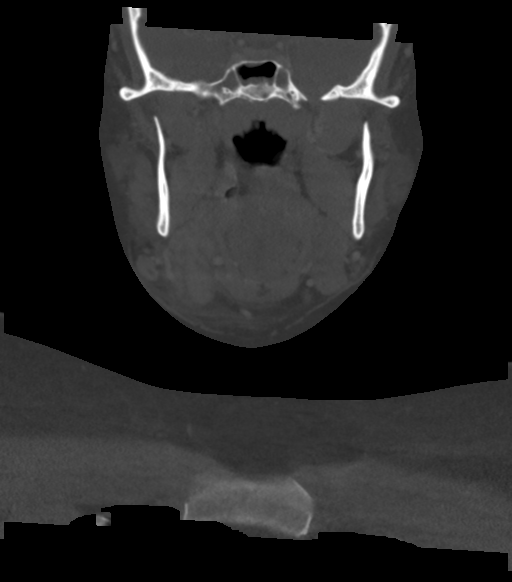
[im 76/126  bone]
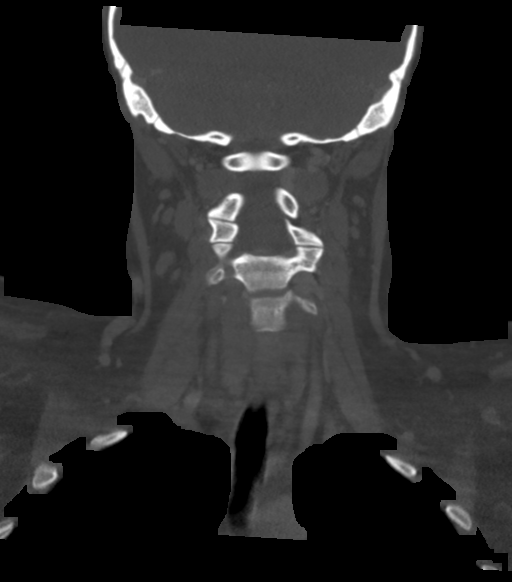

[13 of 35 positions shown; findings below may reference images not displayed]

FINDINGS: Pharynx and larynx: Inflammatory changes associated with the right
floor of mouth and submandibular gland are described below.
Unremarkable appearance of the larynx. No evidence of a mass.

Salivary glands: Enlarged and edematous right submandibular gland
with surrounding edema in the submandibular space. There also is
edema tracking along the right floor of the mouth with
asymmetrically enlarged right mylohyoid.No calculus or discrete,
drainable fluid collection identified. The left submandibular gland
and bilateral parotid glands are within normal limits.

Thyroid: Normal.

Lymph nodes: No pathologically enlarged lymph nodes. Mildly
prominent jugulodigastric and right submandibular nodes, nonspecific
but potentially reactive given above findings.

Vascular: Limited evaluation due to non arterial timing. Great
vessels appear grossly patent. Suspected non dominant/small right
vertebral artery.

Limited intracranial: Negative.

Visualized orbits: Negative.

Mastoids and visualized paranasal sinuses: Moderate mucosal
thickening of the inferior maxillary sinuses, scattered ethmoid air
cells and left greater than right sphenoid sinuses.

Skeleton: Reversal of the normal cervical lordosis. No evidence of
acute abnormality on limited assessment.

Upper chest: Visualized lung apices are clear.

Other: Right middle ear and mastoid effusion
IMPRESSION: 1. Findings compatible with acute right submandibular sialadenitis.
Edema in the surrounding submandibular space, potentially cellulitis
or reactive. Additionally, there is soft tissue thickening and edema
involving the right floor mouth, suspicious for direct spread of
infection with possible inflammation/thickening of the submandibular
duct. No definite mass lesion; however, recommend correlation with
direct inspection of the floor of mouth. No calculus or discrete,
drainable fluid collection identified.
2. Moderate paranasal sinus mucosal thickening, detailed above.
3. Right mastoid and middle ear effusion.

## 2022-07-29 ENCOUNTER — Ambulatory Visit (INDEPENDENT_AMBULATORY_CARE_PROVIDER_SITE_OTHER): Payer: Commercial Managed Care - HMO | Admitting: Internal Medicine

## 2022-07-29 ENCOUNTER — Encounter: Payer: Self-pay | Admitting: Internal Medicine

## 2022-07-29 VITALS — BP 122/80 | HR 74 | Temp 97.5°F | Ht 66.0 in | Wt 298.0 lb

## 2022-07-29 DIAGNOSIS — H6501 Acute serous otitis media, right ear: Secondary | ICD-10-CM | POA: Insufficient documentation

## 2022-07-29 MED ORDER — AMOXICILLIN-POT CLAVULANATE 875-125 MG PO TABS: 1.0000 | ORAL_TABLET | Freq: Two times a day (BID) | ORAL | 0 refills | Status: DC

## 2022-07-29 NOTE — Assessment & Plan Note (Signed)
Due to going on a month and worsening, will try augmentin 875 bid for 1 week Continue flonase and xyzal Consider ENT evaluation

## 2022-07-29 NOTE — Progress Notes (Signed)
Subjective:    Patient ID: Susan Vazquez, female    DOB: [birth year], 21 y.o.   MRN: [id number]  HPI Here due to persistent respiratory symptoms  Has full feeling in head and decreased hearing from right ear Sinus symptoms for almost a month Using OTC meds but the ear worsened recently No fever Some sinus drainage No cough No SOB  Current Outpatient Medications on File Prior to Visit  Medication Sig Dispense Refill   levocetirizine (XYZAL) 5 MG tablet Take 1 tablet (5 mg total) by mouth every evening. 90 tablet 1   Prenatal Vit-Fe Fumarate-FA (PREPLUS) 27-1 MG TABS Take 1 tablet by mouth daily. 30 tablet 11   letrozole (FEMARA) 2.5 MG tablet Take 1 tab (2.5 mg) daily on cycle day 3-7 (Patient not taking: Reported on 07/29/2022) 5 tablet 2   [DISCONTINUED] cetirizine (ZYRTEC) 10 MG tablet Take 10 mg by mouth daily.       No current facility-administered medications on file prior to visit.    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   Irregular periods 07/02/2020   PCOS (polycystic ovarian syndrome)    Screening for diabetes mellitus 07/02/2020   Seasonal allergies    Weight gain 07/02/2020    Past Surgical History:  Procedure Laterality Date   ADENOIDECTOMY Bilateral 2007   TONSILLECTOMY Bilateral    2007   WISDOM TOOTH EXTRACTION      Family History  Problem Relation Age of Onset   Diabetes Mother    Melanoma Father        stage 3 melonoma cancer   Dementia Maternal Grandmother    Suicidality Maternal Grandfather     Social History   Socioeconomic History   Marital status: Significant Other    Spouse name: Not on file   Number of children: 0   Years of education: Not on file   Highest education level: Not on file   Occupational History   Occupation: [employer]    Employer: [employer]  Tobacco Use   Smoking status: Never   Smokeless tobacco: Never  Vaping Use   Vaping Use: Former  Substance and Sexual Activity   Alcohol use: Not Currently   Drug use: Never   Sexual activity: Not Currently    Partners: Male  Other Topics Concern   Not on file  Social History Narrative   Not on file   Social Determinants of Health   Financial Resource Strain: Low Risk  (10/25/2021)   Overall Financial Resource Strain (CARDIA)    Difficulty of Paying Living Expenses: Not hard at all  Food Insecurity: No Food Insecurity (10/25/2021)   Hunger Vital Sign    Worried About Running Out of Food in the Last Year: Never true    Ran Out of Food in the Last Year: Never true  Transportation Needs: No Transportation Needs (10/25/2021)   PRAPARE - Hydrologist (Medical): No    Lack of Transportation (Non-Medical): No  Physical Activity: Insufficiently Active (10/25/2021)   Exercise Vital Sign    Days of Exercise per Week: 3 days    Minutes of Exercise per Session: 20 min  Stress: No Stress Concern Present (10/25/2021)  [place] Questionnaire    Feeling of Stress : Only a little  Social Connections: Moderately Integrated (10/25/2021)   Social Connection and Isolation Panel [NHANES]    Frequency of Communication with Friends and Family: More than three times a week    Frequency of Social Gatherings with Friends and Family: Once a week    Attends Religious Services: 1 to 4 times per year    Active Member of [employer] or Organizations: No    Attends Archivist Meetings: Never    Marital Status: Living with partner  Intimate Partner Violence: Not At Risk (10/25/2021)   Humiliation, Afraid, Rape, and Kick questionnaire    Fear of Current or Ex-Partner: No    Emotionally Abused: No    Physically Abused: No    Sexually Abused: No    Review of Systems Usually gets yearly sinus infection-----ear infections as child No N/V No sore throat Eating okay    Objective:   Physical Exam Constitutional:      Appearance: Normal appearance.  HENT:     Right Ear: Ear canal normal.     Left Ear: Tympanic membrane and ear canal normal.     Ears:     Comments: Right TM with mild scarring. No inflammation but ?some fluid    Nose: Congestion present.     Mouth/Throat:     Pharynx: No oropharyngeal exudate or posterior oropharyngeal erythema.  Pulmonary:     Effort: Pulmonary effort is normal.     Breath sounds: Normal breath sounds. No wheezing or rales.  Musculoskeletal:     Cervical back: Neck supple.  Lymphadenopathy:     Cervical: No cervical adenopathy.  Neurological:     Mental Status: She is alert.            Assessment & Plan:

## 2022-08-01 DIAGNOSIS — Z419 Encounter for procedure for purposes other than remedying health state, unspecified: Secondary | ICD-10-CM | POA: Diagnosis not present

## 2022-08-09 ENCOUNTER — Telehealth: Payer: Commercial Managed Care - HMO

## 2022-08-16 ENCOUNTER — Ambulatory Visit: Payer: Commercial Managed Care - HMO | Admitting: Advanced Practice Midwife

## 2022-08-18 ENCOUNTER — Ambulatory Visit
Admission: EM | Admit: 2022-08-18 | Discharge: 2022-08-18 | Disposition: A | Discharge status: Home / Self Care | Payer: Medicaid Other

## 2022-08-18 DIAGNOSIS — H6693 Otitis media, unspecified, bilateral: Secondary | ICD-10-CM | POA: Diagnosis not present

## 2022-08-18 HISTORY — DX: Complete or unspecified spontaneous abortion without complication: O03.9

## 2022-08-18 LAB — POCT RAPID STREP A (OFFICE): Rapid Strep A Screen: NEGATIVE

## 2022-08-18 MED ORDER — CEFDINIR 300 MG PO CAPS: 300.0000 mg | ORAL_CAPSULE | Freq: Two times a day (BID) | ORAL | 0 refills | Status: AC

## 2022-08-18 NOTE — ED Provider Notes (Signed)
[facility]    CSN: [id number] Arrival date & time: 08/18/22  1120      History   Chief Complaint Chief Complaint  Patient presents with   Sore Throat    Sore throat, ears hurt, headache & fatigue - Entered by patient    HPI Susan Vazquez is a 22 y.o. female.  Patient presents with bilateral ear pain, fatigue, headache, sore throat, x 1 day.  She reports sinus pressure and congestion x 1 month.  No fever, rash, cough, shortness of breath, vomiting, diarrhea, or other symptoms.  No OTC medications taken today.  Patient was seen by her PCP on 07/29/2022; diagnosed with right otitis media; treated with Augmentin.  Her medical history includes PCOS, morbid obesity, anxiety, seasonal allergies, miscarriage.  The history is provided by medical records and the patient.    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   Irregular periods 07/02/2020   Miscarriage    PCOS (polycystic ovarian syndrome)    Screening for diabetes mellitus 07/02/2020   Seasonal allergies    Weight gain 07/02/2020    Patient Active Problem List   Diagnosis Date Noted   Acute serous otitis media, right ear 07/29/2022   Screening for lipoid disorders 03/28/2022   Encounter for general adult medical examination with abnormal findings 03/28/2022   Patient desires pregnancy 12/15/2021   Seasonal allergic rhinitis due to pollen 11/04/2021   ASCUS with positive high risk HPV cervical 10/29/2021   PCOS (polycystic ovarian syndrome) 09/24/2021   Anxiety 08/30/2021   Morbid obesity with BMI of 45.0-49.9, adult ([place]) 07/02/2020    Past Surgical History:  Procedure Laterality Date   ADENOIDECTOMY Bilateral 2007   TONSILLECTOMY Bilateral    2007   WISDOM TOOTH EXTRACTION      OB History     Gravida  1   Para  0    Term  0   Preterm  0   AB  0   Living  0      SAB  0   IAB  0   Ectopic  0   Multiple  0   Live Births  0            Home Medications    Prior to Admission medications   Medication Sig Start Date End Date Taking? Authorizing Provider  cefdinir (OMNICEF) 300 MG capsule Take 1 capsule (300 mg total) by mouth 2 (two) times daily for 10 days. 08/18/22 08/28/22 Yes Sharion Balloon, NP  fluticasone (FLONASE) 50 MCG/ACT nasal spray Place into both nostrils daily.   Yes [provider]  letrozole [facility]) 2.5 MG tablet Take 1 tab (2.5 mg) daily on cycle day 3-7 07/18/22   Luvenia Redden, PA-C  levocetirizine (XYZAL) 5 MG tablet Take 1 tablet (5 mg total) by mouth every evening. 05/02/22   Eugenia Pancoast, FNP  Prenatal Vit-Fe Fumarate-FA (PREPLUS) 27-1 MG TABS Take 1 tablet by mouth daily. 06/27/22   Chancy Milroy, MD  cetirizine (ZYRTEC) 10 MG tablet Take 10 mg by mouth daily.    03/04/20  [provider]    Family History Family History  Problem Relation Age of Onset   Diabetes Mother    Melanoma Father  stage 3 melonoma cancer   Dementia Maternal Grandmother    Suicidality Maternal Grandfather     Social History Social History   Tobacco Use   Smoking status: Never   Smokeless tobacco: Never  Vaping Use   Vaping Use: Former  Substance Use Topics   Alcohol use: Not Currently   Drug use: Never     Allergies   Sulfa antibiotics   Review of Systems Review of Systems  Constitutional:  Positive for fatigue. Negative for chills and fever.  HENT:  Positive for congestion, ear pain, sinus pressure and sore throat.   Respiratory:  Negative for cough and shortness of breath.   Cardiovascular:  Negative for chest pain and palpitations.  Gastrointestinal:  Negative for abdominal pain, diarrhea and vomiting.  Skin:  Negative for color change and rash.  Neurological:  Positive for headaches.  All other systems reviewed and are  negative.    Physical Exam Triage Vital Signs ED Triage Vitals  Enc Vitals Group     BP      Pulse      Resp      Temp      Temp src      SpO2      Weight      Height      Head Circumference      Peak Flow      Pain Score      Pain Loc      Pain Edu?      Excl. in [place]?    No data found.  Updated Vital Signs BP 101/71   Pulse 85   Temp 98.4 F (36.9 C)   Resp 18   Ht '5\' 6"'$  (1.676 m)   Wt 298 lb (135.2 kg)   LMP 08/12/2022   SpO2 99%   BMI 48.10 kg/m   Visual Acuity Right Eye Distance:   Left Eye Distance:   Bilateral Distance:    Right Eye Near:   Left Eye Near:    Bilateral Near:     Physical Exam Vitals and nursing note reviewed.  Constitutional:      General: She is not in acute distress.    Appearance: She is well-developed. She is obese. She is not ill-appearing.  HENT:     Right Ear: Tympanic membrane is erythematous.     Left Ear: Tympanic membrane is erythematous.     Nose: Nose normal.     Mouth/Throat:     Mouth: Mucous membranes are moist.     Pharynx: Oropharynx is clear.  Cardiovascular:     Rate and Rhythm: Normal rate and regular rhythm.     Heart sounds: Normal heart sounds.  Pulmonary:     Effort: Pulmonary effort is normal. No respiratory distress.     Breath sounds: Normal breath sounds.  Musculoskeletal:     Cervical back: Neck supple.  Skin:    General: Skin is warm and dry.  Neurological:     Mental Status: She is alert.  Psychiatric:        Mood and Affect: Mood normal.        Behavior: Behavior normal.      UC Treatments / Results  Labs (all labs ordered are listed, but only abnormal results are displayed) Labs Reviewed  POCT RAPID STREP A (OFFICE)    EKG   Radiology No results found.  Procedures Procedures (including critical care time)  Medications Ordered in UC Medications - No data to display  Initial  Impression / Assessment and Plan / UC Course  I have reviewed the triage vital signs and the  nursing notes.  Pertinent labs & imaging results that were available during my care of the patient were reviewed by me and considered in my medical decision making (see chart for details).   Bilateral otitis media.  Patient was recently treated with 7 day course of Augmentin for right OM.  Treating today with 10 day course of cefdinir.  Discussed symptomatic treatment including Tylenol or ibuprofen, rest, hydration.  Instructed patient to follow up with her PCP.   She agrees to plan of care.    Final Clinical Impressions(s) / UC Diagnoses   Final diagnoses:  Bilateral otitis media, unspecified otitis media type     Discharge Instructions      Take the cefdinir as directed.  Follow up with your primary care provider.        ED Prescriptions     Medication Sig Dispense Auth. Provider   cefdinir (OMNICEF) 300 MG capsule Take 1 capsule (300 mg total) by mouth 2 (two) times daily for 10 days. 20 capsule Sharion Balloon, NP      PDMP not reviewed this encounter.   Sharion Balloon, NP 08/18/22 [phone]

## 2022-08-18 NOTE — Discharge Instructions (Addendum)
Take the cefdinir as directed.  Follow up with your primary care provider.

## 2022-08-18 NOTE — ED Triage Notes (Signed)
Patient to Urgent Care with complaints of sore throat, bilateral ear pain, headache, sinus pain, and fatigue.  Symptoms started yesterday. Temp last night 99.   Reports frequent sinus issues over the last two months. Recently treated for an ear infection with Augmentin.

## 2022-09-01 DIAGNOSIS — Z419 Encounter for procedure for purposes other than remedying health state, unspecified: Secondary | ICD-10-CM | POA: Diagnosis not present

## 2022-09-06 ENCOUNTER — Ambulatory Visit
Admission: EM | Admit: 2022-09-06 | Discharge: 2022-09-06 | Disposition: A | Discharge status: Home / Self Care | Payer: Medicaid Other

## 2022-09-06 DIAGNOSIS — J069 Acute upper respiratory infection, unspecified: Secondary | ICD-10-CM

## 2022-09-06 NOTE — ED Provider Notes (Signed)
Susan Vazquez    CSN: [id number] Arrival date & time: 09/06/22  1327      History   Chief Complaint Chief Complaint  Patient presents with   Fever    Sinus pressure, ears hurt, sore throat, cold & body aches - Entered by patient    HPI Susan Vazquez is a 22 y.o. female.  Patient presents with 3-day history of bodyaches, sinus pressure, congestion, ear pain, sore throat.  She reports fever today; Tmax 99.7.  No OTC medication taken today.  She denies rash, cough, shortness of breath, vomiting, diarrhea, or other symptoms.  Patient was seen here on 08/18/2022; diagnosed with bilateral otitis media; treated with cefdinir.  She was seen by her PCP on 07/29/2022 for otitis media and was treated with Augmentin.  The history is provided by the patient and medical records.    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   Irregular periods 07/02/2020   Miscarriage    PCOS (polycystic ovarian syndrome)    Screening for diabetes mellitus 07/02/2020   Seasonal allergies    Weight gain 07/02/2020    Patient Active Problem List   Diagnosis Date Noted   Acute serous otitis media, right ear 07/29/2022   Screening for lipoid disorders 03/28/2022   Encounter for general adult medical examination with abnormal findings 03/28/2022   Patient desires pregnancy 12/15/2021   Seasonal allergic rhinitis due to pollen 11/04/2021   ASCUS with positive high risk HPV cervical 10/29/2021   PCOS (polycystic ovarian syndrome) 09/24/2021   Anxiety 08/30/2021   Morbid obesity with BMI of 45.0-49.9, adult ([place]) 07/02/2020    Past Surgical History:  Procedure Laterality Date   ADENOIDECTOMY Bilateral 2007   TONSILLECTOMY Bilateral    2007   WISDOM TOOTH EXTRACTION      OB History     Gravida  1   Para  0    Term  0   Preterm  0   AB  0   Living  0      SAB  0   IAB  0   Ectopic  0   Multiple  0   Live Births  0            Home Medications    Prior to Admission medications   Medication Sig Start Date End Date Taking? Authorizing Provider  fluticasone (FLONASE) 50 MCG/ACT nasal spray Place into both nostrils daily.    [provider]  letrozole [facility]) 2.5 MG tablet Take 1 tab (2.5 mg) daily on cycle day 3-7 07/18/22   Luvenia Redden, PA-C  levocetirizine (XYZAL) 5 MG tablet Take 1 tablet (5 mg total) by mouth every evening. 05/02/22   Eugenia Pancoast, FNP  Prenatal Vit-Fe Fumarate-FA (PREPLUS) 27-1 MG TABS Take 1 tablet by mouth daily. 06/27/22   Chancy Milroy, MD  cetirizine (ZYRTEC) 10 MG tablet Take 10 mg by mouth daily.    03/04/20  [provider]    Family History Family History  Problem Relation Age of Onset   Diabetes Mother    Melanoma Father        stage 3 melonoma cancer   Dementia Maternal Grandmother    Suicidality Maternal Grandfather     Social History Social History  Tobacco Use   Smoking status: Never   Smokeless tobacco: Never  Vaping Use   Vaping Use: Former  Substance Use Topics   Alcohol use: Not Currently   Drug use: Never     Allergies   Sulfa antibiotics   Review of Systems Review of Systems  Constitutional:  Negative for chills and fever.  HENT:  Positive for congestion, ear pain, sinus pressure and sore throat.   Respiratory:  Negative for cough and shortness of breath.   Cardiovascular:  Negative for chest pain and palpitations.  Gastrointestinal:  Negative for diarrhea and vomiting.  Skin:  Negative for color change and rash.  All other systems reviewed and are negative.    Physical Exam Triage Vital Signs ED Triage Vitals  Enc Vitals Group     BP --      Pulse Rate 09/06/22 1432 (!) 115     Resp 09/06/22 1432 16     Temp 09/06/22 1432 99.7 F (37.6 C)     Temp src --      SpO2 09/06/22  1432 98 %     Weight 09/06/22 1431 298 lb (135.2 kg)     Height 09/06/22 1431 '5\' 6"'$  (1.676 m)     Head Circumference --      Peak Flow --      Pain Score 09/06/22 1421 0     Pain Loc --      Pain Edu? --      Excl. in [place]? --    No data found.  Updated Vital Signs BP 128/84   Pulse (!) 115   Temp 99.7 F (37.6 C)   Resp 16   Ht '5\' 6"'$  (1.676 m)   Wt 298 lb (135.2 kg)   LMP 08/12/2022   SpO2 98%   BMI 48.10 kg/m   Visual Acuity Right Eye Distance:   Left Eye Distance:   Bilateral Distance:    Right Eye Near:   Left Eye Near:    Bilateral Near:     Physical Exam Vitals and nursing note reviewed.  Constitutional:      General: She is not in acute distress.    Appearance: She is well-developed. She is not ill-appearing.  HENT:     Right Ear: Tympanic membrane normal.     Left Ear: Tympanic membrane normal.     Nose: Nose normal.     Mouth/Throat:     Mouth: Mucous membranes are moist.     Pharynx: Oropharynx is clear.  Cardiovascular:     Rate and Rhythm: Normal rate and regular rhythm.     Heart sounds: Normal heart sounds.  Pulmonary:     Effort: Pulmonary effort is normal. No respiratory distress.     Breath sounds: Normal breath sounds.  Musculoskeletal:     Cervical back: Neck supple.  Skin:    General: Skin is warm and dry.  Neurological:     Mental Status: She is alert.  Psychiatric:        Mood and Affect: Mood normal.        Behavior: Behavior normal.      UC Treatments / Results  Labs (all labs ordered are listed, but only abnormal results are displayed) Labs Reviewed - No data to display  EKG   Radiology No results found.  Procedures Procedures (including critical care time)  Medications Ordered in UC Medications - No data to display  Initial Impression / Assessment and Plan / UC Course  I  have reviewed the triage vital signs and the nursing notes.  Pertinent labs & imaging results that were available during my care of the  patient were reviewed by me and considered in my medical decision making (see chart for details).    Viral URI.  Discussed symptomatic treatment including Tylenol or ibuprofen, rest, hydration.  Instructed patient to follow up with her PCP if symptoms are not improving.  Education provides on viral URI.  She agrees to plan of care.   Final Clinical Impressions(s) / UC Diagnoses   Final diagnoses:  Viral URI     Discharge Instructions      Take Tylenol as needed for fever or discomfort.  Rest and keep yourself hydrated.    Follow-up with your primary care provider if your symptoms are not improving.         ED Prescriptions   None    PDMP not reviewed this encounter.   Sharion Balloon, NP 09/06/22 1452

## 2022-09-06 NOTE — Discharge Instructions (Addendum)
Take Tylenol as needed for fever or discomfort.  Rest and keep yourself hydrated.    Follow-up with your primary care provider if your symptoms are not improving.     

## 2022-09-06 NOTE — ED Triage Notes (Signed)
Patient to Urgent Care with complaints of sinus pressure, bilateral ear pain, sore throat, cold and body aches. Fever today.  Symptoms started three days ago.

## 2022-09-12 ENCOUNTER — Other Ambulatory Visit: Payer: Self-pay | Admitting: Medical

## 2022-09-12 DIAGNOSIS — E282 Polycystic ovarian syndrome: Secondary | ICD-10-CM

## 2022-09-12 MED ORDER — MEDROXYPROGESTERONE ACETATE 10 MG PO TABS: 10.0000 mg | ORAL_TABLET | Freq: Every day | ORAL | 3 refills | Status: DC

## 2022-09-30 DIAGNOSIS — Z419 Encounter for procedure for purposes other than remedying health state, unspecified: Secondary | ICD-10-CM | POA: Diagnosis not present

## 2022-10-14 DIAGNOSIS — E282 Polycystic ovarian syndrome: Secondary | ICD-10-CM

## 2022-10-14 MED ORDER — LETROZOLE 2.5 MG PO TABS: 5.0000 mg | ORAL_TABLET | Freq: Every day | ORAL | 2 refills | Status: DC

## 2022-10-30 ENCOUNTER — Other Ambulatory Visit: Payer: Self-pay | Admitting: Family

## 2022-10-30 DIAGNOSIS — J301 Allergic rhinitis due to pollen: Secondary | ICD-10-CM

## 2022-10-31 DIAGNOSIS — Z419 Encounter for procedure for purposes other than remedying health state, unspecified: Secondary | ICD-10-CM | POA: Diagnosis not present

## 2022-11-07 ENCOUNTER — Ambulatory Visit: Payer: Medicaid Other | Admitting: Medical

## 2022-11-30 DIAGNOSIS — Z419 Encounter for procedure for purposes other than remedying health state, unspecified: Secondary | ICD-10-CM | POA: Diagnosis not present

## 2022-12-23 ENCOUNTER — Encounter: Payer: Self-pay | Admitting: Family

## 2022-12-30 ENCOUNTER — Other Ambulatory Visit (HOSPITAL_COMMUNITY)
Admission: RE | Admit: 2022-12-30 | Discharge: 2022-12-30 | Disposition: A | Discharge status: Home / Self Care | Payer: Medicaid Other | Source: Ambulatory Visit | Attending: Medical | Admitting: Medical

## 2022-12-30 ENCOUNTER — Other Ambulatory Visit: Payer: Self-pay

## 2022-12-30 ENCOUNTER — Ambulatory Visit (INDEPENDENT_AMBULATORY_CARE_PROVIDER_SITE_OTHER): Payer: Medicaid Other | Admitting: Obstetrics & Gynecology

## 2022-12-30 ENCOUNTER — Encounter: Payer: Self-pay | Admitting: Obstetrics & Gynecology

## 2022-12-30 VITALS — BP 131/80 | HR 79 | Ht 67.0 in | Wt 299.0 lb

## 2022-12-30 DIAGNOSIS — R87610 Atypical squamous cells of undetermined significance on cytologic smear of cervix (ASC-US): Secondary | ICD-10-CM

## 2022-12-30 DIAGNOSIS — R87810 Cervical high risk human papillomavirus (HPV) DNA test positive: Secondary | ICD-10-CM

## 2022-12-30 DIAGNOSIS — Z124 Encounter for screening for malignant neoplasm of cervix: Secondary | ICD-10-CM

## 2022-12-30 NOTE — Progress Notes (Signed)
Patient ID: Susan Vazquez, female   DOB: [birth year], 22 y.o.   MRN: [id number]  Chief Complaint  Patient presents with   Annual Exam    HPI Susan Vazquez is a 22 y.o. female.  [id number] Patient's last menstrual period was 12/20/2022 (exact date). Patient needs repeat pap smear due to 3/23 result ASCUS pos HR HPV, done at [facility].  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   Irregular periods 07/02/2020   Miscarriage    PCOS (polycystic ovarian syndrome)    Screening for diabetes mellitus 07/02/2020   Seasonal allergies    Weight gain 07/02/2020    Past Surgical History:  Procedure Laterality Date   ADENOIDECTOMY Bilateral 2007   TONSILLECTOMY Bilateral    2007   WISDOM TOOTH EXTRACTION      Family History  Problem Relation Age of Onset   Diabetes Mother    Melanoma Father        stage 3 melonoma cancer   Dementia Maternal Grandmother    Suicidality Maternal Grandfather     Social History Social History   Tobacco Use   Smoking status: Never   Smokeless tobacco: Never  Vaping Use   Vaping Use: Former  Substance Use Topics   Alcohol use: Not Currently   Drug use: Never    Allergies  Allergen Reactions   Sulfa Antibiotics Rash    Current Outpatient Medications  Medication Sig Dispense Refill   fluticasone (FLONASE) 50 MCG/ACT nasal spray Place into both nostrils daily.     levocetirizine (XYZAL) 5 MG tablet TAKE 1 TABLET BY MOUTH ONCE DAILY IN THE EVENING 90 tablet 3   No current facility-administered medications for this visit.    Review of Systems Review of Systems  Constitutional: Negative.     Blood pressure 131/80, pulse 79, height 5\' 7"  (1.702 m), weight 299 lb (135.6 kg), last menstrual period 12/20/2022, unknown if currently breastfeeding.  Physical  Exam Physical Exam Vitals and nursing note reviewed. Exam conducted with a chaperone present.  Constitutional:      Appearance: She is obese. She is not ill-appearing.  Pulmonary:     Effort: Pulmonary effort is normal.  Genitourinary:    General: Normal vulva.     Exam position: Lithotomy position.     Vagina: Normal.     Cervix: Normal.  Skin:    General: Skin is warm and dry.  Neurological:     Mental Status: She is alert.  Psychiatric:        Mood and Affect: Mood normal.        Behavior: Behavior normal.     Data Reviewed 09/2021  Cytology - PAP  HIGH RISK HPV ([place]): Positive Important  NEISSERIA GONORRHEA ([place]): Negative CHLAMYDIA ([place]): Negative ADEQUACY: Satisfactory for evaluation; transformation zone component ABSENT. DIAGNOSIS: - Atypical squamous cells of undetermined significance (ASC-US) Important  Assessment Pap smear for cervical cancer screening - Plan: Cytology - PAP( [place])  ASCUS with positive high risk HPV cervical   Plan F/U repeat pap 12 months if normal    Susan Vazquez 12/30/2022, 8:52 AM

## 2022-12-31 DIAGNOSIS — Z419 Encounter for procedure for purposes other than remedying health state, unspecified: Secondary | ICD-10-CM | POA: Diagnosis not present

## 2023-01-04 LAB — CYTOLOGY - PAP
Chlamydia: NEGATIVE
Comment: NEGATIVE
Comment: NORMAL
Diagnosis: NEGATIVE
Diagnosis: REACTIVE
Neisseria Gonorrhea: NEGATIVE

## 2023-01-09 ENCOUNTER — Encounter: Payer: Self-pay | Admitting: Obstetrics & Gynecology

## 2023-01-30 DIAGNOSIS — Z419 Encounter for procedure for purposes other than remedying health state, unspecified: Secondary | ICD-10-CM | POA: Diagnosis not present

## 2023-03-01 ENCOUNTER — Encounter (INDEPENDENT_AMBULATORY_CARE_PROVIDER_SITE_OTHER): Payer: Self-pay

## 2023-03-02 DIAGNOSIS — Z419 Encounter for procedure for purposes other than remedying health state, unspecified: Secondary | ICD-10-CM | POA: Diagnosis not present

## 2023-03-08 ENCOUNTER — Ambulatory Visit: Payer: Medicaid Other | Admitting: Dermatology

## 2023-03-08 DIAGNOSIS — L918 Other hypertrophic disorders of the skin: Secondary | ICD-10-CM

## 2023-03-08 DIAGNOSIS — L814 Other melanin hyperpigmentation: Secondary | ICD-10-CM | POA: Diagnosis not present

## 2023-03-08 DIAGNOSIS — L821 Other seborrheic keratosis: Secondary | ICD-10-CM | POA: Diagnosis not present

## 2023-03-08 DIAGNOSIS — Z86018 Personal history of other benign neoplasm: Secondary | ICD-10-CM

## 2023-03-08 DIAGNOSIS — L578 Other skin changes due to chronic exposure to nonionizing radiation: Secondary | ICD-10-CM | POA: Diagnosis not present

## 2023-03-08 DIAGNOSIS — Z1283 Encounter for screening for malignant neoplasm of skin: Secondary | ICD-10-CM | POA: Diagnosis not present

## 2023-03-08 DIAGNOSIS — D1801 Hemangioma of skin and subcutaneous tissue: Secondary | ICD-10-CM | POA: Diagnosis not present

## 2023-03-08 DIAGNOSIS — Z808 Family history of malignant neoplasm of other organs or systems: Secondary | ICD-10-CM | POA: Diagnosis not present

## 2023-03-08 DIAGNOSIS — D229 Melanocytic nevi, unspecified: Secondary | ICD-10-CM

## 2023-03-08 DIAGNOSIS — W908XXA Exposure to other nonionizing radiation, initial encounter: Secondary | ICD-10-CM | POA: Diagnosis not present

## 2023-03-08 NOTE — Progress Notes (Signed)
   Follow-Up Visit   Subjective  Susan Vazquez is a 22 y.o. female who presents for the following: Skin Cancer Screening and Full Body Skin Exam Family history of melanoma, hx of dysplastic   The patient presents for Total-Body Skin Exam (TBSE) for skin cancer screening and mole check. The patient has spots, moles and lesions to be evaluated, some may be new or changing and the patient may have concern these could be cancer.    The following portions of the chart were reviewed this encounter and updated as appropriate: medications, allergies, medical history  Review of Systems:  No other skin or systemic complaints except as noted in HPI or Assessment and Plan.  Objective  Well appearing patient in no apparent distress; mood and affect are within normal limits.  A full examination was performed including scalp, head, eyes, ears, nose, lips, neck, chest, axillae, abdomen, back, buttocks, bilateral upper extremities, bilateral lower extremities, hands, feet, fingers, toes, fingernails, and toenails. All findings within normal limits unless otherwise noted below.   Relevant physical exam findings are noted in the Assessment and Plan.    Assessment & Plan   SKIN CANCER SCREENING PERFORMED TODAY.  ACTINIC DAMAGE - Chronic condition, secondary to cumulative UV/sun exposure - diffuse scaly erythematous macules with underlying dyspigmentation - Recommend daily broad spectrum sunscreen SPF 30+ to sun-exposed areas, reapply every 2 hours as needed.  - Staying in the shade or wearing long sleeves, sun glasses (UVA+UVB protection) and wide brim hats (4-inch brim around the entire circumference of the hat) are also recommended for sun protection.  - Call for new or changing lesions.  LENTIGINES, SEBORRHEIC KERATOSES, HEMANGIOMAS - Benign normal skin lesions - Benign-appearing - Call for any changes  MELANOCYTIC NEVI - Tan-brown and/or pink-flesh-colored symmetric macules and papules -  Benign appearing on exam today - Observation - Call clinic for new or changing moles - Recommend daily use of broad spectrum spf 30+ sunscreen to sun-exposed areas.   Acrochordons (Skin Tags) - Fleshy, skin-colored pedunculated papules - Benign appearing.  - Observe. - If desired, they can be removed with an in office procedure that is not covered by insurance. - Please call the clinic if you notice any new or changing lesions.  Vascular birthmark at sacral area Exam: pink macule on sacral area   Treatment Plan: Benign. 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  Call if any new or changing lesions are noted between office visits  FAMILY HISTORY OF SKIN CANCER What type(s):melanoma Who affected:father, cousin  Return in about 1 year (around 03/07/2024) for TBSE.  IAsher Muir, CMA, am acting as scribe for Armida Sans, MD.   Documentation: I have reviewed the above documentation for accuracy and completeness, and I agree with the above.  Armida Sans, MD

## 2023-03-08 NOTE — Patient Instructions (Signed)
Melanoma ABCDEs  Melanoma is the most dangerous type of skin cancer, and is the leading cause of death from skin disease.  You are more likely to develop melanoma if you: Have light-colored skin, light-colored eyes, or red or blond hair Spend a lot of time in the sun Tan regularly, either outdoors or in a tanning bed Have had blistering sunburns, especially during childhood Have a close family member who has had a melanoma Have atypical moles or large birthmarks  Early detection of melanoma is key since treatment is typically straightforward and cure rates are extremely high if we catch it early.   The first sign of melanoma is often a change in a mole or a new dark spot.  The ABCDE system is a way of remembering the signs of melanoma.  A for asymmetry:  The two halves do not match. B for border:  The edges of the growth are irregular. C for color:  A mixture of colors are present instead of an even brown color. D for diameter:  Melanomas are usually (but not always) greater than 6mm - the size of a pencil eraser. E for evolution:  The spot keeps changing in size, shape, and color.  Please check your skin once per month between visits. You can use a small mirror in front and a large mirror behind you to keep an eye on the back side or your body.   If you see any new or changing lesions before your next follow-up, please call to schedule a visit.  Please continue daily skin protection including broad spectrum sunscreen SPF 30+ to sun-exposed areas, reapplying every 2 hours as needed when you're outdoors.   Staying in the shade or wearing long sleeves, sun glasses (UVA+UVB protection) and wide brim hats (4-inch brim around the entire circumference of the hat) are also recommended for sun protection.     Due to recent changes in healthcare laws, you may see results of your pathology and/or laboratory studies on MyChart before the doctors have had a chance to review them. We understand that  in some cases there may be results that are confusing or concerning to you. Please understand that not all results are received at the same time and often the doctors may need to interpret multiple results in order to provide you with the best plan of care or course of treatment. Therefore, we ask that you please give [place] 2 business days to thoroughly review all your results before contacting the office for clarification. Should we see a critical lab result, you will be contacted sooner.   If You Need Anything After Your Visit  If you have any questions or concerns for your doctor, please call our main line at [phone] and press option 4 to reach your doctor's medical assistant. If no one answers, please leave a voicemail as directed and we will return your call as soon as possible. Messages left after 4 pm will be answered the following business day.   You may also send [place] a message via MyChart. We typically respond to MyChart messages within 1-2 business days.  For prescription refills, please ask your pharmacy to contact our office. Our fax number is [phone].  If you have an urgent issue when the clinic is closed that cannot wait until the next business day, you can page your doctor at the number below.    Please note that while we do our best to be available for urgent issues outside of office hours,  we are not available 24/7.   If you have an urgent issue and are unable to reach [place], you may choose to seek medical care at your doctor's office, retail clinic, urgent care center, or emergency room.  If you have a medical emergency, please immediately call 911 or go to the emergency department.  Pager Numbers  - Dr. Gwen Pounds: [phone]  - Dr. Roseanne Reno: [phone]  In the event of inclement weather, please call our main line at [phone] for an update on the status of any delays or closures.  Dermatology Medication Tips: Please keep the boxes that topical medications come  in in order to help keep track of the instructions about where and how to use these. Pharmacies typically print the medication instructions only on the boxes and not directly on the medication tubes.   If your medication is too expensive, please contact our office at [phone] option 4 or send [place] a message through MyChart.   We are unable to tell what your co-pay for medications will be in advance as this is different depending on your insurance coverage. However, we may be able to find a substitute medication at lower cost or fill out paperwork to get insurance to cover a needed medication.   If a prior authorization is required to get your medication covered by your insurance company, please allow [place] 1-2 business days to complete this process.  Drug prices often vary depending on where the prescription is filled and some pharmacies may offer cheaper prices.  The website www.goodrx.com contains coupons for medications through different pharmacies. The prices here do not account for what the cost may be with help from insurance (it may be cheaper with your insurance), but the website can give you the price if you did not use any insurance.  - You can print the associated coupon and take it with your prescription to the pharmacy.  - You may also stop by our office during regular business hours and pick up a GoodRx coupon card.  - If you need your prescription sent electronically to a different pharmacy, notify our office through [facility] or by phone at [phone] option 4.     Si Usted Necesita Algo Despus de Su Visita  Tambin puede enviarnos un mensaje a travs de Clinical cytogeneticist. Por lo general respondemos a los mensajes de MyChart en el transcurso de 1 a 2 das hbiles.  Para renovar recetas, por favor pida a su farmacia que se ponga en contacto con nuestra oficina. Annie Sable de fax es [place] [phone].  Si tiene un asunto urgente cuando la clnica est cerrada y que no puede  esperar hasta el siguiente da hbil, puede llamar/localizar a su doctor(a) al nmero que aparece a continuacin.   Por favor, tenga en cuenta que aunque hacemos todo lo posible para estar disponibles para asuntos urgentes fuera del horario de [place], no estamos disponibles las 24 horas del da, los 7 [address]  [address] de la [place].   Si tiene un problema urgente y no puede comunicarse con nosotros, puede optar por buscar atencin mdica  en el consultorio de su doctor(a), en una clnica privada, en un centro de atencin urgente o en una sala de emergencias.  Si tiene Engineer, drilling, por favor llame inmediatamente al 911 o vaya a la sala de emergencias.  Nmeros de bper  - Dr. Gwen Pounds: [phone]  - Dra. Roseanne Reno: [phone]  En caso de inclemencias del tiempo, por favor llame a nuestra lnea principal al [phone] para  una actualizacin [employer] de cualquier retraso o cierre.  Consejos para la medicacin en dermatologa: Por favor, guarde las cajas en las que vienen los medicamentos de uso tpico para ayudarle a seguir las instrucciones sobre dnde y cmo usarlos. Las farmacias generalmente imprimen las instrucciones del medicamento slo en las cajas y no directamente en los tubos del [place].   Si su medicamento es muy caro, por favor, pngase en contacto con Rolm [place] llamando al [phone] y presione la opcin 4 o envenos un mensaje a travs de Clinical cytogeneticist.   No podemos decirle cul ser su copago por los medicamentos por adelantado ya que esto es diferente dependiendo de la cobertura de su seguro. Sin embargo, es posible que podamos encontrar un medicamento sustituto a Audiological scientist un formulario para que el seguro cubra el medicamento que se considera necesario.   Si se requiere una autorizacin previa para que su compaa de seguros [place] su medicamento, por favor permtanos de 1 a 2 das hbiles para completar [address]  Los precios de los medicamentos  varan con frecuencia dependiendo del Environmental consultant de dnde se surte la receta y alguna farmacias pueden ofrecer precios ms baratos.  El sitio web www.goodrx.com tiene cupones para medicamentos de Health and safety inspector. Los precios aqu no tienen en cuenta lo que podra costar con la ayuda del seguro (puede ser ms barato con su seguro), pero el sitio web puede darle el precio si no utiliz Tourist information centre manager.  - Puede imprimir el cupn correspondiente y llevarlo con su receta a la farmacia.  - Tambin puede pasar por nuestra oficina durante el horario de atencin regular y Education officer, museum una tarjeta de cupones de GoodRx.  - Si necesita que su receta se enve electrnicamente a una farmacia diferente, informe a nuestra oficina a travs de MyChart de [place] o por telfono llamando al [phone] y presione la opcin 4.

## 2023-03-15 ENCOUNTER — Telehealth: Payer: Self-pay | Admitting: Family

## 2023-03-15 NOTE — Telephone Encounter (Signed)
Contacted pt to cancel lab appt prior to cpe. Pt stated last yr, she was told by [facility] to have labs done prior, to discuss at visit. Told pt [facility]ugal switched to doing same day labs with her pts. Per pt's preference, she'd like to keep her lab appt on 8/26. Can lab orders be submitted? Call back # [phone]

## 2023-03-16 NOTE — Telephone Encounter (Signed)
Discussed with pt. She is cancelling lab appt and will be seen at her physical.

## 2023-03-17 ENCOUNTER — Encounter: Payer: Self-pay | Admitting: Dermatology

## 2023-03-27 ENCOUNTER — Other Ambulatory Visit: Payer: Medicaid Other

## 2023-03-30 ENCOUNTER — Ambulatory Visit (INDEPENDENT_AMBULATORY_CARE_PROVIDER_SITE_OTHER): Payer: Medicaid Other | Admitting: Family

## 2023-03-30 ENCOUNTER — Encounter: Payer: Self-pay | Admitting: Family

## 2023-03-30 VITALS — BP 124/86 | Temp 98.0°F | Ht 66.0 in | Wt 303.0 lb

## 2023-03-30 DIAGNOSIS — Z0000 Encounter for general adult medical examination without abnormal findings: Secondary | ICD-10-CM | POA: Diagnosis not present

## 2023-03-30 DIAGNOSIS — F419 Anxiety disorder, unspecified: Secondary | ICD-10-CM

## 2023-03-30 DIAGNOSIS — Z6841 Body Mass Index (BMI) 40.0 and over, adult: Secondary | ICD-10-CM | POA: Diagnosis not present

## 2023-03-30 DIAGNOSIS — Z8742 Personal history of other diseases of the female genital tract: Secondary | ICD-10-CM | POA: Insufficient documentation

## 2023-03-30 DIAGNOSIS — Z1159 Encounter for screening for other viral diseases: Secondary | ICD-10-CM

## 2023-03-30 DIAGNOSIS — Z13220 Encounter for screening for lipoid disorders: Secondary | ICD-10-CM | POA: Diagnosis not present

## 2023-03-30 DIAGNOSIS — Z113 Encounter for screening for infections with a predominantly sexual mode of transmission: Secondary | ICD-10-CM | POA: Diagnosis not present

## 2023-03-30 DIAGNOSIS — Z0001 Encounter for general adult medical examination with abnormal findings: Secondary | ICD-10-CM

## 2023-03-30 DIAGNOSIS — Z114 Encounter for screening for human immunodeficiency virus [HIV]: Secondary | ICD-10-CM

## 2023-03-30 LAB — BASIC METABOLIC PANEL
BUN: 11 mg/dL (ref 6–23)
CO2: 29 mEq/L (ref 19–32)
Calcium: 9.8 mg/dL (ref 8.4–10.5)
Chloride: 102 mEq/L (ref 96–112)
Creatinine, Ser: 0.66 mg/dL (ref 0.40–1.20)
GFR: 123.73 mL/min (ref >60.00)
Glucose, Bld: 79 mg/dL (ref 70–99)
Potassium: 4.5 mEq/L (ref 3.5–5.1)
Sodium: 139 mEq/L (ref 135–145)

## 2023-03-30 LAB — LIPID PANEL
Cholesterol: 160 mg/dL (ref 0–200)
HDL: 43.8 mg/dL (ref >39.00)
LDL Cholesterol: 90 mg/dL (ref 0–99)
NonHDL: 115.86
Total CHOL/HDL Ratio: 4
Triglycerides: 130 mg/dL (ref 0.0–149.0)
VLDL: 26 mg/dL (ref 0.0–40.0)

## 2023-03-30 LAB — COMPREHENSIVE METABOLIC PANEL
ALT: 22 U/L (ref 0–35)
AST: 16 U/L (ref 0–37)
Albumin: 4.3 g/dL (ref 3.5–5.2)
Alkaline Phosphatase: 71 U/L (ref 39–117)
BUN: 11 mg/dL (ref 6–23)
CO2: 29 mEq/L (ref 19–32)
Calcium: 9.8 mg/dL (ref 8.4–10.5)
Chloride: 102 mEq/L (ref 96–112)
Creatinine, Ser: 0.66 mg/dL (ref 0.40–1.20)
GFR: 123.73 mL/min (ref >60.00)
Glucose, Bld: 79 mg/dL (ref 70–99)
Potassium: 4.5 mEq/L (ref 3.5–5.1)
Sodium: 139 mEq/L (ref 135–145)
Total Bilirubin: 0.5 mg/dL (ref 0.2–1.2)
Total Protein: 7.1 g/dL (ref 6.0–8.3)

## 2023-03-30 LAB — CBC
HCT: 41.4 % (ref 36.0–46.0)
Hemoglobin: 13.7 g/dL (ref 12.0–15.0)
MCHC: 33 g/dL (ref 30.0–36.0)
MCV: 86.6 fl (ref 78.0–100.0)
Platelets: 305 10*3/uL (ref 150.0–400.0)
RBC: 4.78 Mil/uL (ref 3.87–5.11)
RDW: 13.2 % (ref 11.5–15.5)
WBC: 8.3 10*3/uL (ref 4.0–10.5)

## 2023-03-30 LAB — TSH: TSH: 1.7 u[IU]/mL (ref 0.35–5.50)

## 2023-03-30 NOTE — Assessment & Plan Note (Signed)
D/w pt hpv vaccination as was hpv + which resolved.  Pt desires this, will have to get at [facility] department.

## 2023-03-30 NOTE — Assessment & Plan Note (Signed)

## 2023-03-30 NOTE — Assessment & Plan Note (Signed)
Stable

## 2023-03-30 NOTE — Patient Instructions (Signed)
  Recommend HPV vaccination  As well as flu vaccination.

## 2023-03-30 NOTE — Progress Notes (Signed)
Subjective:  Patient ID: Susan Vazquez, female    DOB: [birth year]  Age: 22 y.o. MRN: [id number]  Patient Care Team: Mort Sawyers, FNP as PCP - General (Family Medicine)   CC:  Chief Complaint  Patient presents with   Annual Exam    HPI Susan Vazquez is a 22 y.o. female who presents today for an annual physical exam. She reports consuming a general diet.  Walking a few days a week  She generally feels well. She reports sleeping well. She does not have additional problems to discuss today.   Vision:Within last year Dental:Receives regular dental care STD:The patient denies history of sexually transmitted disease.   Last pap: Dec 20, 2022, negative  Flu vaccine: medicaid, but will get at [facility]  Hpv vaccination: will consider, is medicaid   Pt is without acute concerns.   Advanced Directives Patient does not have advanced directives   DEPRESSION SCREENING    03/30/2023   10:16 AM 12/30/2022    8:32 AM 12/15/2021    1:57 PM 10/25/2021   11:05 AM 09/24/2021   12:37 PM 08/30/2021    4:07 PM 07/02/2020    9:40 AM  PHQ 2/9 Scores  PHQ - 2 Score 0 0 1 0 1 1 0  PHQ- 9 Score 1 0 2 0 3  0     ROS: Negative unless specifically indicated above in HPI.    Current Outpatient Medications:    fluticasone (FLONASE) 50 MCG/ACT nasal spray, Place into both nostrils daily., Disp: , Rfl:    levocetirizine (XYZAL) 5 MG tablet, TAKE 1 TABLET BY MOUTH ONCE DAILY IN THE EVENING, Disp: 90 tablet, Rfl: 3    Objective:    BP 124/86 (BP Location: Left Arm, Patient Position: Sitting, Cuff Size: Large)   Temp 98 F (36.7 C) (Oral)   Ht 5\' 6"  (1.676 m)   Wt (!) 303 lb (137.4 kg)   SpO2 98%   BMI 48.91 kg/m   BP Readings from Last 3 Encounters:  03/30/23 124/86  12/30/22 131/80  09/06/22 128/84      Physical Exam Constitutional:      General: She is not in acute distress.    Appearance: Normal appearance. She is obese. She is not ill-appearing.  HENT:     Head:  Normocephalic.     Right Ear: Tympanic membrane normal.     Left Ear: Tympanic membrane normal.     Nose: Nose normal.     Mouth/Throat:     Mouth: Mucous membranes are moist.  Eyes:     Extraocular Movements: Extraocular movements intact.     Pupils: Pupils are equal, round, and reactive to light.  Cardiovascular:     Rate and Rhythm: Normal rate and regular rhythm.  Pulmonary:     Effort: Pulmonary effort is normal.     Breath sounds: Normal breath sounds.  Abdominal:     General: Abdomen is flat. Bowel sounds are normal.     Palpations: Abdomen is soft.     Tenderness: There is no guarding or rebound.  Musculoskeletal:        General: Normal range of motion.     Cervical back: Normal range of motion.  Skin:    General: Skin is warm.     Capillary Refill: Capillary refill takes less than 2 seconds.  Neurological:     General: No focal deficit present.     Mental Status: She is alert.  Psychiatric:  Mood and Affect: Mood normal.        Behavior: Behavior normal.        Thought Content: Thought content normal.        Judgment: Judgment normal.          Assessment & Plan:  Encounter for general adult medical examination with abnormal findings Assessment & Plan: Patient Counseling(The following topics were reviewed):  Preventative care handout given to pt  Health maintenance and immunizations reviewed. Please refer to Health maintenance section. Pt advised on safe sex, wearing seatbelts in car, and proper nutrition labwork ordered today for annual Dental health: Discussed importance of regular tooth brushing, flossing, and dental visits.   Orders: -     Comprehensive metabolic panel -     Hepatitis C antibody -     TSH  Screening examination for STD (sexually transmitted disease)  Encounter for general adult medical examination without abnormal findings -     Lipid panel -     CBC -     Basic metabolic panel  Screening for lipoid disorders -      Lipid panel  Screening for STD (sexually transmitted disease) -     RPR -     HIV Antibody (routine testing w rflx) -     Chlamydia/Gonococcus/Trichomonas, NAA  Encounter for hepatitis C screening test for low risk patient -     Hepatitis C antibody  Screening for HIV (human immunodeficiency virus) -     HIV Antibody (routine testing w rflx)  History of abnormal cervical Pap smear Assessment & Plan: D/w pt hpv vaccination as was hpv + which resolved.  Pt desires this, will have to get at health department.    Anxiety Assessment & Plan: Stable.     Morbid obesity with BMI of 45.0-49.9, adult [facility]) Assessment & Plan: Patient Counseling(The following topics were reviewed):  Preventative care handout given to pt  Health maintenance and immunizations reviewed. Please refer to Health maintenance section. Pt advised on safe sex, wearing seatbelts in car, and proper nutrition labwork ordered today for annual Dental health: Discussed importance of regular tooth brushing, flossing, and dental visits.        Follow-up: Return in about 1 year (around 03/29/2024) for f/u CPE.   Mort Sawyers, FNP

## 2023-03-31 LAB — HEPATITIS C ANTIBODY: Hepatitis C Ab: NONREACTIVE

## 2023-03-31 LAB — HIV ANTIBODY (ROUTINE TESTING W REFLEX): HIV 1&2 Ab, 4th Generation: NONREACTIVE

## 2023-03-31 LAB — RPR: RPR Ser Ql: NONREACTIVE

## 2023-04-04 LAB — CHLAMYDIA/GONOCOCCUS/TRICHOMONAS, NAA
Chlamydia by NAA: NEGATIVE
Gonococcus by NAA: NEGATIVE
Trich vag by NAA: NEGATIVE

## 2023-04-08 ENCOUNTER — Other Ambulatory Visit: Payer: Self-pay | Admitting: Family

## 2023-04-19 ENCOUNTER — Encounter: Payer: Medicaid Other | Admitting: Dermatology

## 2023-04-27 ENCOUNTER — Ambulatory Visit: Payer: Self-pay

## 2023-04-27 ENCOUNTER — Ambulatory Visit
Admission: EM | Admit: 2023-04-27 | Discharge: 2023-04-27 | Disposition: A | Discharge status: Home / Self Care | Payer: Medicaid Other | Attending: Internal Medicine | Admitting: Internal Medicine

## 2023-04-27 ENCOUNTER — Ambulatory Visit: Payer: Self-pay | Admitting: *Deleted

## 2023-04-27 DIAGNOSIS — R531 Weakness: Secondary | ICD-10-CM | POA: Diagnosis not present

## 2023-04-27 DIAGNOSIS — R82998 Other abnormal findings in urine: Secondary | ICD-10-CM | POA: Insufficient documentation

## 2023-04-27 DIAGNOSIS — R110 Nausea: Secondary | ICD-10-CM | POA: Insufficient documentation

## 2023-04-27 LAB — POCT URINALYSIS DIP (MANUAL ENTRY)
Bilirubin, UA: NEGATIVE
Blood, UA: NEGATIVE
Glucose, UA: NEGATIVE mg/dL
Ketones, POC UA: NEGATIVE mg/dL
Nitrite, UA: NEGATIVE
Protein Ur, POC: NEGATIVE mg/dL
Spec Grav, UA: 1.02 (ref 1.010–1.025)
Urobilinogen, UA: 0.2 E.U./dL
pH, UA: 7.5 (ref 5.0–8.0)

## 2023-04-27 LAB — POCT URINE PREGNANCY: Preg Test, Ur: NEGATIVE

## 2023-04-27 LAB — POCT FASTING CBG KUC MANUAL ENTRY: POCT Glucose (KUC): 78 mg/dL (ref 70–99)

## 2023-04-27 NOTE — Telephone Encounter (Signed)
Summary: Seeking clinical advice.   Pt called requesting to speak with nurse Paulette again. She stated that she cannot see her PCP today and wants to know where nurse recommends, she go and be seen.  Seeking clinical advice.    Patient has spoken to [facility] earlier today- she was not able to get appointment with her PCP. Patient wants to know where her care options are. Patient advised: [facility]- address/hours given, UC- but they will probably expect payment for services, ED.  Reason for Disposition . General information question, no triage required and triager able to answer question  Answer Assessment - Initial Assessment Questions 1. REASON FOR CALL or QUESTION: "What is your reason for calling today?" or "How can I best help you?" or "What question do you have that I can help answer?"     Follow up call to earlier triage call- care options requested  Protocols used: Information Only Call - No Triage-A-AH

## 2023-04-27 NOTE — Telephone Encounter (Signed)
  Chief Complaint: informational call- care options requested Symptoms: see previous triage  Disposition: [] ED /[] Urgent Care (no appt availability in office) / [] Appointment(In office/virtual)/ []  [facility] [facility]/ [] Home Care/ [] Refused Recommended Disposition /[x] [place] [facility]/ []  Follow-up with PCP Additional Notes: Patient called back - she is requesting care options- her PCP can not see her today.  Patient advised: [facility]- address/hours given, UC- but they will probably expect payment for services, ED

## 2023-04-27 NOTE — Discharge Instructions (Signed)
The clinic will contact you with results of the testing done today if positive.  Eat small frequent meals and stay hydrated.  Please follow-up with your PCP in 1 to 2 days for recheck and further workup of the symptoms.  Please go to the ER if you develop any worsening symptoms.  I hope you feel better soon!

## 2023-04-27 NOTE — Telephone Encounter (Signed)
  Chief Complaint: dizziness, fatigue, nausea Symptoms: above Frequency: a few times over the past 2 weeks Pertinent Negatives: Patient denies  Disposition: [] ED /[] Urgent Care (no appt availability in office) / [x] Appointment(In office/virtual)/ []  [place] [facility]/ [] Home Care/ [] Refused Recommended Disposition /[] [place] [facility]/ []  Follow-up with PCP Additional Notes: Pt has had a few instances of lightheadedness and nausea over the past week and a half. Pt states that these symptoms resolve with eating. But then feels very lethargic. Pt will call her PCP to be since today.  Pt took pregnancy test - negative result. Pt is concerned about finances, her Medicaid has stopped. Gave her phone number to [facility] financial folks.    Reason for Disposition  [1] MODERATE dizziness (e.g., interferes with normal activities) AND [2] has NOT been evaluated by doctor (or NP/PA) for this  (Exception: Dizziness caused by heat exposure, sudden standing, or poor fluid intake.)  Answer Assessment - Initial Assessment Questions 1. DESCRIPTION: "Describe your dizziness."     lightheaded 3. VERTIGO: "Do you feel like either you or the room is spinning or tilting?" (i.e. vertigo)     no 4. SEVERITY: "How bad is it?"  "Do you feel like you are going to faint?" "Can you stand and walk?"   - MILD: Feels slightly dizzy, but walking normally.   - MODERATE: Feels unsteady when walking, but not falling; interferes with normal activities (e.g., school, work).   - SEVERE: Unable to walk without falling, or requires assistance to walk without falling; feels like passing out now.      Mild moderate 5. ONSET:  "When did the dizziness begin?"     Off and on since last week. 8. CAUSE: "What do you think is causing the dizziness?"     unsure 9. RECURRENT SYMPTOM: "Have you had dizziness before?" If Yes, ask: "When was the last time?" "What happened that time?"     A few times over the past few days 10. OTHER  SYMPTOMS: "Do you have any other symptoms?" (e.g., fever, chest pain, vomiting, diarrhea, bleeding)       Nausea - fatigue - shakes 11. PREGNANCY: "Is there any chance you are pregnant?" "When was your last menstrual period?"       Possible  Protocols used: Dizziness - Lightheadedness-A-AH

## 2023-04-27 NOTE — ED Provider Notes (Signed)
[facility]    CSN: [id number] Arrival date & time: 04/27/23  1018      History   Chief Complaint Chief Complaint  Patient presents with   Nausea   Weakness    HPI Susan Vazquez is a 22 y.o. female presents for nausea and weakness.  Patient reports she was awoken 3 times in the past week with nausea and extreme weakness.  Denies any vomiting but does endorse some dizziness this morning while in the shower.  No syncope, URI symptoms, diarrhea, abdominal pain, fevers or chills, dysuria, flank pain.  States symptoms improve when she eats.  She does report her last menstrual cycle was August 16 and she is not currently on birth control.  Reports a negative home pregnancy test done today.  Denies any history of diabetes or prediabetes.  Does have a history of PCOS.  Had routine blood work on August 29 with normal CMP, CBC, and TSH.  No recent travel.  No change in diet or any new medications.  No other concerns at this time.   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   Irregular periods 07/02/2020   Miscarriage    PCOS (polycystic ovarian syndrome)    Screening for diabetes mellitus 07/02/2020   Seasonal allergies    Weight gain 07/02/2020    Patient Active Problem List   Diagnosis Date Noted   History of abnormal cervical Pap smear 03/30/2023   Patient desires pregnancy 12/15/2021   Seasonal allergic rhinitis due to pollen 11/04/2021   PCOS (polycystic ovarian syndrome) 09/24/2021   Anxiety 08/30/2021   Morbid obesity with BMI of 45.0-49.9, adult (HCC) 07/02/2020    Past Surgical History:  Procedure Laterality Date   ADENOIDECTOMY Bilateral 2007   TONSILLECTOMY Bilateral    2007   WISDOM TOOTH EXTRACTION      OB History     Gravida   2   Para  0   Term  0   Preterm  0   AB  1   Living  0      SAB  1   IAB  0   Ectopic  0   Multiple  0   Live Births  0            Home Medications    Prior to Admission medications   Medication Sig Start Date End Date Taking? Authorizing Provider  fluticasone (FLONASE) 50 MCG/ACT nasal spray Use 2 spray(s) in each nostril once daily 04/10/23   Mort Sawyers, FNP  levocetirizine (XYZAL) 5 MG tablet TAKE 1 TABLET BY MOUTH ONCE DAILY IN THE EVENING 10/31/22   Mort Sawyers, FNP  cetirizine (ZYRTEC) 10 MG tablet Take 10 mg by mouth daily.    03/04/20  [provider]    Family History Family History  Problem Relation Age of Onset   Diabetes Mother    Melanoma Father        stage 3 melonoma cancer   Dementia Maternal Grandmother    Suicidality Maternal Grandfather     Social History Social History   Tobacco Use   Smoking status: Never   Smokeless tobacco: Never  Vaping Use   Vaping status: Former  Substance Use Topics  Alcohol use: Not Currently   Drug use: Never     Allergies   Sulfa antibiotics   Review of Systems Review of Systems  Gastrointestinal:  Positive for nausea.  Neurological:  Positive for weakness.     Physical Exam Triage Vital Signs ED Triage Vitals [04/27/23 1027]  Encounter Vitals Group     BP 134/88     Systolic BP Percentile      Diastolic BP Percentile      Pulse Rate 77     Resp 17     Temp 98.7 F (37.1 C)     Temp Source Oral     SpO2 98 %     Weight      Height      Head Circumference      Peak Flow      Pain Score 0     Pain Loc      Pain Education      Exclude from Growth Chart    No data found.  Updated Vital Signs BP 134/88 (BP Location: Right Arm)   Pulse 77   Temp 98.7 F (37.1 C) (Oral)   Resp 17   LMP 03/13/2023 (Exact Date)   SpO2 98%   Visual Acuity Right Eye Distance:   Left Eye Distance:   Bilateral Distance:    Right Eye Near:   Left Eye Near:    Bilateral Near:      Physical Exam Vitals and nursing note reviewed.  Constitutional:      General: She is not in acute distress.    Appearance: Normal appearance. She is obese. She is not ill-appearing.  HENT:     Head: Normocephalic and atraumatic.  Eyes:     Pupils: Pupils are equal, round, and reactive to light.  Cardiovascular:     Rate and Rhythm: Normal rate and regular rhythm.     Heart sounds: Normal heart sounds.  Pulmonary:     Effort: Pulmonary effort is normal.     Breath sounds: Normal breath sounds.  Skin:    General: Skin is warm and dry.  Neurological:     General: No focal deficit present.     Mental Status: She is alert and oriented to person, place, and time.  Psychiatric:        Mood and Affect: Mood normal.        Behavior: Behavior normal.      UC Treatments / Results  Labs (all labs ordered are listed, but only abnormal results are displayed) Labs Reviewed  POCT URINALYSIS DIP (MANUAL ENTRY) - Abnormal; Notable for the following components:      Result Value   Leukocytes, UA Small (1+) (*)    All other components within normal limits  URINE CULTURE  BETA HCG QUANT (REF LAB)  POCT URINE PREGNANCY  POCT FASTING CBG [facility] MANUAL ENTRY    EKG   Radiology No results found.  Procedures Procedures (including critical care time)  Medications Ordered in UC Medications - No data to display  Initial Impression / Assessment and Plan / UC Course  I have reviewed the triage vital signs and the nursing notes.  Pertinent labs & imaging results that were available during my care of the patient were reviewed by me and considered in my medical decision making (see chart for details).     Discussed symptoms with patient.  Negative urine hCG in clinic.  UA with leukocytes the patient denies any dysuria.  Will culture.  Will  do blood work for hCG as patient reports she wants to confirm she is not pregnant.  Point-of-care blood sugar 2 hours postprandial was 78.  Advised to  eat small frequent meals and remain hydrated.  Will contact for results of any of the testing done today if positive.  Unclear cause of her symptoms that do not feel emergent evaluation is indicated at this time.  Advised PCP follow-up 1 to 2 days for recheck.  ER precautions were reviewed and patient verbalized understanding. Final Clinical Impressions(s) / UC Diagnoses   Final diagnoses:  Leukocytes in urine  Nausea  Weakness   Discharge Instructions   None    ED Prescriptions   None    PDMP not reviewed this encounter.   Radford Pax, NP 04/27/23 1102

## 2023-04-27 NOTE — ED Triage Notes (Addendum)
Pt presents with c/o nausea and weakness since this morning.   States this is the third episode she has had of nausea and weakness within one wk. States when she eats she feels better when eating.

## 2023-04-28 LAB — BETA HCG QUANT (REF LAB): hCG Quant: <1 m[IU]/mL

## 2023-04-29 LAB — URINE CULTURE: Culture: <10000 — AB

## 2023-06-02 DIAGNOSIS — Z419 Encounter for procedure for purposes other than remedying health state, unspecified: Secondary | ICD-10-CM | POA: Diagnosis not present

## 2023-06-22 ENCOUNTER — Encounter: Payer: Self-pay | Admitting: Family

## 2023-06-22 ENCOUNTER — Telehealth: Payer: Self-pay | Admitting: Family

## 2023-06-22 ENCOUNTER — Ambulatory Visit: Payer: Medicaid Other | Admitting: Family

## 2023-06-22 VITALS — BP 120/86 | HR 74 | Temp 98.0°F | Ht 66.0 in | Wt 313.0 lb

## 2023-06-22 DIAGNOSIS — Z6841 Body Mass Index (BMI) 40.0 and over, adult: Secondary | ICD-10-CM

## 2023-06-22 DIAGNOSIS — Z9189 Other specified personal risk factors, not elsewhere classified: Secondary | ICD-10-CM | POA: Diagnosis not present

## 2023-06-22 DIAGNOSIS — E282 Polycystic ovarian syndrome: Secondary | ICD-10-CM

## 2023-06-22 MED ORDER — WEGOVY 1 MG/0.5ML ~~LOC~~ SOAJ: 1.0000 mg | SUBCUTANEOUS | 1 refills | Status: DC

## 2023-06-22 MED ORDER — WEGOVY 0.25 MG/0.5ML ~~LOC~~ SOAJ: SUBCUTANEOUS | 0 refills | Status: DC

## 2023-06-22 MED ORDER — WEGOVY 0.5 MG/0.5ML ~~LOC~~ SOAJ: SUBCUTANEOUS | 2 refills | Status: DC

## 2023-06-22 NOTE — Progress Notes (Signed)
Established Patient Office Visit  Subjective:   Patient ID: Susan Vazquez, female    DOB: Jul 01, 2001  Age: 22 y.o. MRN: [id number]  CC:  Chief Complaint  Patient presents with   Medical Management of Chronic Issues    Discuss weight loss medication options, she would like to try [facility].    HPI: Susan Vazquez is a 22 y.o. female presenting on 06/22/2023 for Medical Management of Chronic Issues (Discuss weight loss medication options, she would like to try [facility].)  Morbid obesity:  Exercise: very little having fast good, and typically cooks at home.  Breakfast, greek yogurt with granola and fruits otherwise bowl of grits (cheese and cheddar) Lunch: sandwich and cheese puffs, otherwise celery cheese and pickles.  Dinner: wide array, Agricultural engineer Exercise: trying to walk almost daily around her complex at home. Trying to get into gym.        ROS: Negative unless specifically indicated above in HPI.   Relevant past medical history reviewed and updated as indicated.   Allergies and medications reviewed and updated.   Current Outpatient Medications:    fluticasone (FLONASE) 50 MCG/ACT nasal spray, Use 2 spray(s) in each nostril once daily, Disp: 48 g, Rfl: 0   levocetirizine (XYZAL) 5 MG tablet, TAKE 1 TABLET BY MOUTH ONCE DAILY IN THE EVENING, Disp: 90 tablet, Rfl: 3   Semaglutide-Weight Management (WEGOVY) 0.25 MG/0.5ML SOAJ, Start 0.25 mg qweek for four weeks, then increase to 0.5 mg dose qweek, Disp: 2 mL, Rfl: 0   Semaglutide-Weight Management (WEGOVY) 0.5 MG/0.5ML SOAJ, Inject [place] 0.5 mg weekly, Disp: 2 mL, Rfl: 2   Semaglutide-Weight Management (WEGOVY) 1 MG/0.5ML SOAJ, Inject 1 mg into the skin once a week., Disp: 6 mL, Rfl: 1  Allergies  Allergen Reactions   Sulfa Antibiotics Rash    Objective:   BP 120/86 (BP Location: Left Arm, Patient Position: Sitting, Cuff Size: Normal)   Pulse 74   Temp 98 F (36.7 C) (Temporal)   Ht 5\' 6"  (1.676 m)    Wt (!) 313 lb (142 kg)   SpO2 98%   BMI 50.52 kg/m    Physical Exam Constitutional:      General: She is not in acute distress.    Appearance: Normal appearance. She is normal weight. She is not ill-appearing, toxic-appearing or diaphoretic.  HENT:     Head: Normocephalic.  Cardiovascular:     Rate and Rhythm: Normal rate.  Pulmonary:     Effort: Pulmonary effort is normal.  Musculoskeletal:        General: Normal range of motion.  Neurological:     General: No focal deficit present.     Mental Status: She is alert and oriented to person, place, and time. Mental status is at baseline.  Psychiatric:        Mood and Affect: Mood normal.        Behavior: Behavior normal.        Thought Content: Thought content normal.        Judgment: Judgment normal.     Assessment & Plan:  PCOS (polycystic ovarian syndrome) -     IEPPIR; Inject [place] 0.5 mg weekly  Dispense: 2 mL; Refill: 2 -     Wegovy; Start 0.25 mg qweek for four weeks, then increase to 0.5 mg dose qweek  Dispense: 2 mL; Refill: 0 -     JJOACZ; Inject 1 mg into the skin once a week.  Dispense: 6 mL; Refill: 1  Morbid obesity with BMI of 45.0-49.9, adult [facility]) Assessment & Plan: The beneficiary does not have any FDA labeled contraindications to the requested agent including pregnancy, lactation, h/o medullary thyroid cancer or multiple endocrine neoplasia type II.   Wegovy trial, rx sent to pharmacy  Discussed potential side effects Recommendation for nutritionist, pt declines for now.   Pt advised to work on diet and exercise as tolerated   Orders: -     ZOXWRU; Inject [place] 0.5 mg weekly  Dispense: 2 mL; Refill: 2 -     Wegovy; Start 0.25 mg qweek for four weeks, then increase to 0.5 mg dose qweek  Dispense: 2 mL; Refill: 0 -     EAVWUJ; Inject 1 mg into the skin once a week.  Dispense: 6 mL; Refill: 1  At risk for diabetes mellitus -     Wegovy; Inject [place] 0.5 mg weekly  Dispense: 2 mL; Refill: 2 -     Wegovy; Start  0.25 mg qweek for four weeks, then increase to 0.5 mg dose qweek  Dispense: 2 mL; Refill: 0 -     WJXBJY; Inject 1 mg into the skin once a week.  Dispense: 6 mL; Refill: 1     Follow up plan: Return in about 3 months (around 09/22/2023) for f/u weight loss medication.  Mort Sawyers, FNP

## 2023-06-22 NOTE — Telephone Encounter (Signed)
Patient called in and stated that she is needing a PA for Semaglutide-Weight Management (WEGOVY) 0.25 MG/0.5ML SOAJ. Thank you!

## 2023-06-22 NOTE — Assessment & Plan Note (Signed)
The beneficiary does not have any FDA labeled contraindications to the requested agent including pregnancy, lactation, h/o medullary thyroid cancer or multiple endocrine neoplasia type II.   Wegovy trial, rx sent to pharmacy  Discussed potential side effects Recommendation for nutritionist, pt declines for now.   Pt advised to work on diet and exercise as tolerated

## 2023-06-23 ENCOUNTER — Telehealth: Payer: Self-pay

## 2023-06-23 ENCOUNTER — Other Ambulatory Visit (HOSPITAL_COMMUNITY): Payer: Self-pay

## 2023-06-23 NOTE — Telephone Encounter (Signed)
Pharmacy Patient Advocate Encounter  Received notification from [facility] Medicaid that Prior Authorization for [facility] 0.25MG /0.5ML auto-injectors has been APPROVED from 06/23/2023 to 06/22/2024. Ran test claim, Copay is $4.00. This test claim was processed through [facility]- copay amounts may vary at other pharmacies due to pharmacy/plan contracts, or as the patient moves through the different stages of their insurance plan.   PA #/Case ID/Reference #: [id number]

## 2023-06-23 NOTE — Telephone Encounter (Signed)
Pharmacy Patient Advocate Encounter   Received notification from Patient Advice Request messages that prior authorization for Wegovy 0.25MG /0.5ML auto-injectors is required/requested.   Insurance verification completed.   The patient is insured through [facility] [place] .   Per test claim: PA required; PA submitted to above mentioned insurance via CoverMyMeds Key/confirmation #/E[facility] Status is pending

## 2023-06-26 ENCOUNTER — Ambulatory Visit
Admission: RE | Admit: 2023-06-26 | Discharge: 2023-06-26 | Disposition: A | Discharge status: Home / Self Care | Payer: Medicaid Other | Source: Ambulatory Visit | Attending: Nurse Practitioner | Admitting: Nurse Practitioner

## 2023-06-26 VITALS — BP 114/77 | HR 82 | Temp 97.9°F | Resp 17

## 2023-06-26 DIAGNOSIS — J3489 Other specified disorders of nose and nasal sinuses: Secondary | ICD-10-CM | POA: Diagnosis not present

## 2023-06-26 DIAGNOSIS — M25512 Pain in left shoulder: Secondary | ICD-10-CM | POA: Diagnosis not present

## 2023-06-26 MED ORDER — AMOXICILLIN-POT CLAVULANATE 875-125 MG PO TABS: 1.0000 | ORAL_TABLET | Freq: Two times a day (BID) | ORAL | 0 refills | Status: DC

## 2023-06-26 NOTE — Discharge Instructions (Addendum)
You have been prescribed on Augmentin 875/125 1 tablet twice daily for 7 days.  Is recommended Mucinex D as directed.  Hydrate well with water.  Continue with allergy medications as prescribed.  Follow-up with primary care provider if symptoms persist or get worse for further evaluation.

## 2023-06-26 NOTE — ED Triage Notes (Signed)
Pt presents with c/o sinus drainage, pressure, congestion and states she has rt ear pain with a headache.

## 2023-06-26 NOTE — ED Provider Notes (Signed)
[facility]    CSN: [id number] Arrival date & time: 06/26/23  1250      History   Chief Complaint Chief Complaint  Patient presents with   Sore Throat    Stuffy nose, sore throat, congestion, drainage and ear pain - Entered by patient    HPI Susan Vazquez is a 22 y.o. female.   HPI  She is in today for evaluation of sinus drainage, pressure, congestion.  She reports that she is also having right ear pain.  She has been taking over-the-counter EmergeC and consuming more vitamin C.  She has a history of allergic rhinitis and is currently treated medically.  She denies any fever, chills dizziness, chills, shortness of breath. 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   Irregular periods 07/02/2020   Miscarriage    PCOS (polycystic ovarian syndrome)    Screening for diabetes mellitus 07/02/2020   Seasonal allergies    Weight gain 07/02/2020    Patient Active Problem List   Diagnosis Date Noted   History of abnormal cervical Pap smear 03/30/2023   Seasonal allergic rhinitis due to pollen 11/04/2021   PCOS (polycystic ovarian syndrome) 09/24/2021   Anxiety 08/30/2021   Morbid obesity with BMI of 45.0-49.9, adult (HCC) 07/02/2020    Past Surgical History:  Procedure Laterality Date   ADENOIDECTOMY Bilateral 2007   TONSILLECTOMY Bilateral    2007   WISDOM TOOTH EXTRACTION      OB History     Gravida  2   Para  0   Term  0   Preterm  0   AB  1   Living  0      SAB  1   IAB  0   Ectopic  0   Multiple  0   Live Births  0            Home Medications    Prior to Admission medications   Medication Sig Start Date End Date Taking? Authorizing Provider  amoxicillin-clavulanate (AUGMENTIN) 875-125 MG tablet Take 1 tablet by mouth every 12 (twelve) hours.  06/26/23  Yes Barbette Merino, NP  fluticasone (FLONASE) 50 MCG/ACT nasal spray Use 2 spray(s) in each nostril once daily 04/10/23   Mort Sawyers, FNP  levocetirizine (XYZAL) 5 MG tablet TAKE 1 TABLET BY MOUTH ONCE DAILY IN THE EVENING 10/31/22   Mort Sawyers, FNP  Semaglutide-Weight Management (WEGOVY) 0.25 MG/0.5ML SOAJ Start 0.25 mg qweek for four weeks, then increase to 0.5 mg dose qweek 06/22/23   Mort Sawyers, FNP  Semaglutide-Weight Management (WEGOVY) 0.5 MG/0.5ML SOAJ Inject [place] 0.5 mg weekly 06/22/23   Mort Sawyers, FNP  Semaglutide-Weight Management (WEGOVY) 1 MG/0.5ML SOAJ Inject 1 mg into the skin once a week. 06/22/23   Mort Sawyers, FNP  cetirizine (ZYRTEC) 10 MG tablet Take 10 mg by mouth daily.    03/04/20  [provider]    Family History Family History  Problem Relation Age of Onset   Diabetes Mother    Melanoma Father        stage 3 melonoma cancer   Dementia Maternal Grandmother    Suicidality Maternal Grandfather     Social History Social History   Tobacco Use   Smoking status: Never   Smokeless tobacco: Never  Vaping Use   Vaping status: Former  Substance Use Topics   Alcohol use: Not Currently   Drug use: Never     Allergies   Sulfa antibiotics   Review of Systems Review of Systems   Physical Exam Triage Vital Signs ED Triage Vitals  Encounter Vitals Group     BP 06/26/23 1318 114/77     Systolic BP Percentile --      Diastolic BP Percentile --      Pulse Rate 06/26/23 1318 82     Resp 06/26/23 1318 17     Temp 06/26/23 1318 97.9 F (36.6 C)     Temp Source 06/26/23 1318 Oral     SpO2 06/26/23 1318 98 %     Weight --      Height --      Head Circumference --      Peak Flow --      Pain Score 06/26/23 1317 5     Pain Loc --      Pain Education --      Exclude from Growth Chart --    No data found.  Updated Vital Signs BP 114/77 (BP Location: Left Arm)   Pulse 82   Temp 97.9 F (36.6 C) (Oral)   Resp 17   LMP  03/13/2023 (Exact Date)   SpO2 98%   Breastfeeding No   Visual Acuity Right Eye Distance:   Left Eye Distance:   Bilateral Distance:    Right Eye Near:   Left Eye Near:    Bilateral Near:     Physical Exam Constitutional:      Appearance: She is obese.  HENT:     Head: Normocephalic and atraumatic.     Right Ear: Tympanic membrane normal.     Left Ear: Tympanic membrane normal.     Mouth/Throat:     Tonsils: No tonsillar exudate or tonsillar abscesses. 0 on the right. 0 on the left.  Eyes:     Conjunctiva/sclera: Conjunctivae normal.  Cardiovascular:     Rate and Rhythm: Normal rate and regular rhythm.     Heart sounds: Normal heart sounds.  Pulmonary:     Effort: Pulmonary effort is normal.  Musculoskeletal:     Cervical back: Normal range of motion.  Skin:    General: Skin is warm.     Capillary Refill: Capillary refill takes less than 2 seconds.  Neurological:     General: No focal deficit present.     Mental Status: She is alert.      UC Treatments / Results  Labs (all labs ordered are listed, but only abnormal results are displayed) Labs Reviewed - No data to display  EKG   Radiology No results found.  Procedures Procedures (including critical care time)  Medications Ordered in UC Medications - No data to display  Initial Impression / Assessment and Plan / UC Course  I have reviewed the triage vital signs and the nursing notes.  Pertinent labs & imaging results that were available during my care of the patient were reviewed by me and considered in my medical decision making (see chart for details).     Sinus pressure Final Clinical Impressions(s) / UC Diagnoses   Final diagnoses:  Sinus pressure     Discharge Instructions      You have been prescribed on Augmentin 875/125 1 tablet twice daily for 7 days.  Is recommended Mucinex D as directed.  Hydrate well with water.  Continue with allergy  medications as prescribed.  Follow-up with  primary care provider if symptoms persist or get worse for further evaluation.     ED Prescriptions     Medication Sig Dispense Auth. Provider   amoxicillin-clavulanate (AUGMENTIN) 875-125 MG tablet Take 1 tablet by mouth every 12 (twelve) hours. 14 tablet Barbette Merino, NP      PDMP not reviewed this encounter.   Thad Ranger [place], NP 06/26/23 1346

## 2023-06-30 ENCOUNTER — Encounter: Payer: Self-pay | Admitting: Family

## 2023-06-30 ENCOUNTER — Ambulatory Visit
Admission: EM | Admit: 2023-06-30 | Discharge: 2023-06-30 | Disposition: A | Discharge status: Home / Self Care | Payer: Medicaid Other | Attending: Family Medicine | Admitting: Family Medicine

## 2023-06-30 DIAGNOSIS — H6691 Otitis media, unspecified, right ear: Secondary | ICD-10-CM | POA: Diagnosis not present

## 2023-06-30 MED ORDER — CEFDINIR 300 MG PO CAPS: 600.0000 mg | ORAL_CAPSULE | Freq: Every day | ORAL | 0 refills | Status: AC

## 2023-06-30 NOTE — Discharge Instructions (Addendum)
Take cefdinir 300 mg--2 capsules together daily for 7 days

## 2023-06-30 NOTE — ED Triage Notes (Signed)
Pt presents to UC for c/o continued sinus pressure, congestion since last visit here 5 days ago. Pt reports now she has difficulty hearing out of the right ear. She has been taking Augmentin as prescribed and it is not improving. She has also been taking Aleve D Pt reports Cefdinir has helped in the past.

## 2023-06-30 NOTE — ED Provider Notes (Signed)
[facility]    CSN: [id number] Arrival date & time: 06/30/23  1359      History   Chief Complaint Chief Complaint  Patient presents with   Facial Pain    HPI Susan Vazquez is a 22 y.o. female.   HPI Here for worsening right ear pain and continued congestion.  She has had the congestion and cough since November 24.  Then she started developing a little ear pain and was seen here on the 25th.  Ears were clear at that time but she was begun on Augmentin.  She states the right ear has been hurting more and more.  Still has some nasal congestion.   she does have a history of otitis media happening about once a year now.  She is allergic to sulfa   last menstrual cycle was August 12.  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   Irregular periods 07/02/2020   Miscarriage    PCOS (polycystic ovarian syndrome)    Screening for diabetes mellitus 07/02/2020   Seasonal allergies    Weight gain 07/02/2020    Patient Active Problem List   Diagnosis Date Noted   History of abnormal cervical Pap smear 03/30/2023   Seasonal allergic rhinitis due to pollen 11/04/2021   PCOS (polycystic ovarian syndrome) 09/24/2021   Anxiety 08/30/2021   Morbid obesity with BMI of 45.0-49.9, adult (HCC) 07/02/2020    Past Surgical History:  Procedure Laterality Date   ADENOIDECTOMY Bilateral 2007   TONSILLECTOMY Bilateral    2007   WISDOM TOOTH EXTRACTION      OB History     Gravida  2   Para  0   Term  0   Preterm  0   AB  1   Living  0      SAB  1   IAB  0   Ectopic  0   Multiple  0   Live Births  0            Home Medications    Prior to Admission medications   Medication Sig Start Date End Date Taking? Authorizing Provider  cefdinir  (OMNICEF) 300 MG capsule Take 2 capsules (600 mg total) by mouth daily for 7 days. 06/30/23 07/07/23 Yes Zenia Resides, MD  fluticasone Aleda Grana) 50 MCG/ACT nasal spray Use 2 spray(s) in each nostril once daily 04/10/23   Mort Sawyers, FNP  levocetirizine (XYZAL) 5 MG tablet TAKE 1 TABLET BY MOUTH ONCE DAILY IN THE EVENING 10/31/22   Mort Sawyers, FNP  Semaglutide-Weight Management (WEGOVY) 0.25 MG/0.5ML SOAJ Start 0.25 mg qweek for four weeks, then increase to 0.5 mg dose qweek 06/22/23   Mort Sawyers, FNP  Semaglutide-Weight Management (WEGOVY) 0.5 MG/0.5ML SOAJ Inject [place] 0.5 mg weekly 06/22/23   Mort Sawyers, FNP  Semaglutide-Weight Management (WEGOVY) 1 MG/0.5ML SOAJ Inject 1 mg into the skin once a week. 06/22/23   Mort Sawyers, FNP  cetirizine (ZYRTEC) 10 MG tablet Take 10 mg by mouth daily.    03/04/20  [provider]    Family History Family History  Problem Relation Age of Onset   Diabetes Mother    Melanoma Father        stage 3 melonoma cancer   Dementia  Maternal Grandmother    Suicidality Maternal Grandfather     Social History Social History   Tobacco Use   Smoking status: Never   Smokeless tobacco: Never  Vaping Use   Vaping status: Former  Substance Use Topics   Alcohol use: Not Currently   Drug use: Never     Allergies   Sulfa antibiotics   Review of Systems Review of Systems   Physical Exam Triage Vital Signs ED Triage Vitals  Encounter Vitals Group     BP 06/30/23 1500 101/80     Systolic BP Percentile --      Diastolic BP Percentile --      Pulse Rate 06/30/23 1500 79     Resp 06/30/23 1500 16     Temp 06/30/23 1500 98.4 F (36.9 C)     Temp Source 06/30/23 1500 Oral     SpO2 06/30/23 1500 96 %     Weight --      Height --      Head Circumference --      Peak Flow --      Pain Score 06/30/23 1459 6     Pain Loc --      Pain Education --      Exclude from Growth Chart --    No data found.  Updated Vital Signs BP  101/80 (BP Location: Left Arm)   Pulse 79   Temp 98.4 F (36.9 C) (Oral)   Resp 16   LMP 03/13/2023 (Exact Date)   SpO2 96%   Visual Acuity Right Eye Distance:   Left Eye Distance:   Bilateral Distance:    Right Eye Near:   Left Eye Near:    Bilateral Near:     Physical Exam Vitals reviewed.  Constitutional:      General: She is not in acute distress.    Appearance: She is not ill-appearing, toxic-appearing or diaphoretic.  HENT:     Right Ear: Ear canal normal.     Left Ear: Tympanic membrane and ear canal normal.     Ears:     Comments: Right tympanic membrane is angry and dull and bulging.    Mouth/Throat:     Mouth: Mucous membranes are moist.  Eyes:     Extraocular Movements: Extraocular movements intact.     Pupils: Pupils are equal, round, and reactive to light.  Cardiovascular:     Rate and Rhythm: Normal rate and regular rhythm.     Heart sounds: No murmur heard. Pulmonary:     Effort: Pulmonary effort is normal.     Breath sounds: Normal breath sounds.  Musculoskeletal:     Cervical back: Neck supple.  Skin:    Coloration: Skin is not jaundiced or pale.  Neurological:     General: No focal deficit present.     Mental Status: She is alert and oriented to person, place, and time.  Psychiatric:        Behavior: Behavior normal.      UC Treatments / Results  Labs (all labs ordered are listed, but only abnormal results are displayed) Labs Reviewed - No data to display  EKG   Radiology No results found.  Procedures Procedures (including critical care time)  Medications Ordered in UC Medications - No data to display  Initial Impression / Assessment and Plan / UC Course  I have reviewed the triage vital signs and the nursing notes.  Pertinent labs & imaging results that were available during my care of  the patient were reviewed by me and considered in my medical decision making (see chart for details).     Currently she does now have an  otitis media that needs biotics.  She requested Omnicef which I think is certainly reasonable.  Omnicef sent into the pharmacy and she will stop taking the Augmentin.  She declined my offer of Toradol.  I did discuss with her some that it may have been a little premature to have antibiotics 2 days into her upper respiratory illness, when her ears look normal.  Discussed with her that it is not usually recommended to take antibiotics to prevent an ear infection.   Final Clinical Impressions(s) / UC Diagnoses   Final diagnoses:  Right otitis media, unspecified otitis media type     Discharge Instructions      Take cefdinir 300 mg--2 capsules together daily for 7 days       ED Prescriptions     Medication Sig Dispense Auth. Provider   cefdinir (OMNICEF) 300 MG capsule Take 2 capsules (600 mg total) by mouth daily for 7 days. 14 capsule Marlinda Mike, Janace Aris, MD      PDMP not reviewed this encounter.   Zenia Resides, MD 06/30/23 [phone]

## 2023-07-02 DIAGNOSIS — Z419 Encounter for procedure for purposes other than remedying health state, unspecified: Secondary | ICD-10-CM | POA: Diagnosis not present

## 2023-07-18 DIAGNOSIS — M25512 Pain in left shoulder: Secondary | ICD-10-CM | POA: Diagnosis not present

## 2023-07-31 DIAGNOSIS — M25512 Pain in left shoulder: Secondary | ICD-10-CM | POA: Diagnosis not present

## 2023-08-02 DIAGNOSIS — Z419 Encounter for procedure for purposes other than remedying health state, unspecified: Secondary | ICD-10-CM | POA: Diagnosis not present

## 2023-08-03 DIAGNOSIS — M25512 Pain in left shoulder: Secondary | ICD-10-CM | POA: Diagnosis not present

## 2023-08-08 DIAGNOSIS — M25512 Pain in left shoulder: Secondary | ICD-10-CM | POA: Diagnosis not present

## 2023-08-10 DIAGNOSIS — M25512 Pain in left shoulder: Secondary | ICD-10-CM | POA: Diagnosis not present

## 2023-08-17 DIAGNOSIS — M25512 Pain in left shoulder: Secondary | ICD-10-CM | POA: Diagnosis not present

## 2023-08-25 DIAGNOSIS — M25512 Pain in left shoulder: Secondary | ICD-10-CM | POA: Diagnosis not present

## 2023-08-31 DIAGNOSIS — M542 Cervicalgia: Secondary | ICD-10-CM | POA: Diagnosis not present

## 2023-08-31 DIAGNOSIS — M25512 Pain in left shoulder: Secondary | ICD-10-CM | POA: Diagnosis not present

## 2023-09-02 DIAGNOSIS — Z419 Encounter for procedure for purposes other than remedying health state, unspecified: Secondary | ICD-10-CM | POA: Diagnosis not present

## 2023-09-22 DIAGNOSIS — M25512 Pain in left shoulder: Secondary | ICD-10-CM | POA: Diagnosis not present

## 2023-09-22 DIAGNOSIS — M542 Cervicalgia: Secondary | ICD-10-CM | POA: Diagnosis not present

## 2023-09-29 DIAGNOSIS — M25512 Pain in left shoulder: Secondary | ICD-10-CM | POA: Diagnosis not present

## 2023-09-30 DIAGNOSIS — Z419 Encounter for procedure for purposes other than remedying health state, unspecified: Secondary | ICD-10-CM | POA: Diagnosis not present

## 2023-10-09 ENCOUNTER — Emergency Department (HOSPITAL_BASED_OUTPATIENT_CLINIC_OR_DEPARTMENT_OTHER): Admitting: Radiology

## 2023-10-09 ENCOUNTER — Other Ambulatory Visit: Payer: Self-pay

## 2023-10-09 ENCOUNTER — Emergency Department (HOSPITAL_BASED_OUTPATIENT_CLINIC_OR_DEPARTMENT_OTHER)
Admission: EM | Admit: 2023-10-09 | Discharge: 2023-10-09 | Disposition: A | Discharge status: Home / Self Care | Attending: Emergency Medicine | Admitting: Emergency Medicine

## 2023-10-09 DIAGNOSIS — R0602 Shortness of breath: Secondary | ICD-10-CM | POA: Insufficient documentation

## 2023-10-09 DIAGNOSIS — R000 Tachycardia, unspecified: Secondary | ICD-10-CM | POA: Diagnosis present

## 2023-10-09 DIAGNOSIS — R42 Dizziness and giddiness: Secondary | ICD-10-CM | POA: Diagnosis not present

## 2023-10-09 DIAGNOSIS — R002 Palpitations: Secondary | ICD-10-CM | POA: Diagnosis not present

## 2023-10-09 LAB — TROPONIN I (HIGH SENSITIVITY): Troponin I (High Sensitivity): <2 ng/L (ref <18)

## 2023-10-09 LAB — BASIC METABOLIC PANEL
Anion gap: 9 (ref 5–15)
BUN: 13 mg/dL (ref 6–20)
CO2: 26 mmol/L (ref 22–32)
Calcium: 9.4 mg/dL (ref 8.9–10.3)
Chloride: 104 mmol/L (ref 98–111)
Creatinine, Ser: 0.56 mg/dL (ref 0.44–1.00)
GFR, Estimated: >60 mL/min (ref >60)
Glucose, Bld: 109 mg/dL — ABNORMAL HIGH (ref 70–99)
Potassium: 3.8 mmol/L (ref 3.5–5.1)
Sodium: 139 mmol/L (ref 135–145)

## 2023-10-09 LAB — CBC
HCT: 39.9 % (ref 36.0–46.0)
Hemoglobin: 13.8 g/dL (ref 12.0–15.0)
MCH: 29.4 pg (ref 26.0–34.0)
MCHC: 34.6 g/dL (ref 30.0–36.0)
MCV: 85.1 fL (ref 80.0–100.0)
Platelets: 295 10*3/uL (ref 150–400)
RBC: 4.69 MIL/uL (ref 3.87–5.11)
RDW: 12.4 % (ref 11.5–15.5)
WBC: 9 10*3/uL (ref 4.0–10.5)
nRBC: 0 % (ref 0.0–0.2)

## 2023-10-09 LAB — PREGNANCY, URINE: Preg Test, Ur: NEGATIVE

## 2023-10-09 NOTE — ED Provider Notes (Signed)
 [place] EMERGENCY DEPARTMENT AT [facility] Provider Note   CSN: [id number] Arrival date & time: 10/09/23  0908     History  Chief Complaint  Patient presents with   Shortness of Breath   Tachycardia    Susan Vazquez is a 23 y.o. female past medical history significant for anxiety who presents today for an episode of lightheadedness and shortness of breath while in the shower this morning.  Patient states that she open the shower to get some cool air which alleviated her symptoms.  Patient notes intermittent tachycardia since.  Patient denies any chest pain, shortness of breath, nausea, vomiting, abdominal pain, headache, or vision changes at this time.   Shortness of Breath      Home Medications Prior to Admission medications   Medication Sig Start Date End Date Taking? Authorizing Provider  fluticasone (FLONASE) 50 MCG/ACT nasal spray Use 2 spray(s) in each nostril once daily 04/10/23   Mort Sawyers, FNP  levocetirizine (XYZAL) 5 MG tablet TAKE 1 TABLET BY MOUTH ONCE DAILY IN THE EVENING 10/31/22   Mort Sawyers, FNP  Semaglutide-Weight Management (WEGOVY) 0.25 MG/0.5ML SOAJ Start 0.25 mg qweek for four weeks, then increase to 0.5 mg dose qweek 06/22/23   Mort Sawyers, FNP  Semaglutide-Weight Management (WEGOVY) 0.5 MG/0.5ML SOAJ Inject [place] 0.5 mg weekly 06/22/23   Mort Sawyers, FNP  Semaglutide-Weight Management (WEGOVY) 1 MG/0.5ML SOAJ Inject 1 mg into the skin once a week. 06/22/23   Mort Sawyers, FNP  cetirizine (ZYRTEC) 10 MG tablet Take 10 mg by mouth daily.    03/04/20  [provider]      Allergies    Sulfa antibiotics    Review of Systems   Review of Systems  Respiratory:  Positive for shortness of breath.   Cardiovascular:  Positive for palpitations.    Physical Exam Updated Vital Signs BP 137/88 (BP Location: Right Arm)   Pulse 92   Temp 98.4 F (36.9 C)   Resp 16   SpO2 98%  Physical Exam Vitals and nursing note reviewed.   Constitutional:      General: She is not in acute distress.    Appearance: She is well-developed. She is obese. She is not ill-appearing, toxic-appearing or diaphoretic.  HENT:     Head: Normocephalic and atraumatic.  Eyes:     Conjunctiva/sclera: Conjunctivae normal.     Pupils: Pupils are equal, round, and reactive to light.  Cardiovascular:     Rate and Rhythm: Normal rate and regular rhythm.     Pulses: Normal pulses.     Heart sounds: Normal heart sounds. No murmur heard. Pulmonary:     Effort: Pulmonary effort is normal. No tachypnea or respiratory distress.     Breath sounds: Normal breath sounds.  Abdominal:     Palpations: Abdomen is soft.     Tenderness: There is no abdominal tenderness.  Musculoskeletal:        General: No swelling.     Cervical back: Neck supple.     Right lower leg: No edema.     Left lower leg: No edema.  Skin:    General: Skin is warm and dry.     Capillary Refill: Capillary refill takes less than 2 seconds.  Neurological:     General: No focal deficit present.     Mental Status: She is alert.  Psychiatric:        Mood and Affect: Mood normal.     ED Results / Procedures / Treatments  Labs (all labs ordered are listed, but only abnormal results are displayed) Labs Reviewed  BASIC METABOLIC PANEL - Abnormal; Notable for the following components:      Result Value   Glucose, Bld 109 (*)    All other components within normal limits  CBC  PREGNANCY, URINE  TROPONIN I (HIGH SENSITIVITY)  TROPONIN I (HIGH SENSITIVITY)    EKG EKG Interpretation Date/Time:  Monday October 09 2023 09:18:57 EDT Ventricular Rate:  82 PR Interval:  152 QRS Duration:  74 QT Interval:  354 QTC Calculation: 413 R Axis:   74  Text Interpretation: Normal sinus rhythm with sinus arrhythmia Normal ECG When compared with ECG of 17-Oct-2005 11:28, PREVIOUS ECG IS PRESENT Confirmed by Tanda Rockers ([id number]) on 10/09/2023 11:28:32 AM  Radiology DG Chest 2 View Result  Date: 10/09/2023 CLINICAL DATA:  Shortness of breath. EXAM: CHEST - 2 VIEW COMPARISON:  None Available. FINDINGS: Bilateral lung fields are clear. Bilateral costophrenic angles are clear. Normal cardio-mediastinal silhouette. No acute osseous abnormalities. The soft tissues are within normal limits. IMPRESSION: No active cardiopulmonary disease. Electronically Signed   By: Jules Schick M.D.   On: 10/09/2023 10:31    Procedures Procedures    Medications Ordered in ED Medications - No data to display  ED Course/ Medical Decision Making/ A&P                                 Medical Decision Making Amount and/or Complexity of Data Reviewed Labs: ordered. Radiology: ordered.   This patient presents to the ED with chief complaint(s) of intermittent tachycardia with pertinent past medical history of anxiety which further complicates the presenting complaint. The complaint involves an extensive differential diagnosis and also carries with it a high risk of complications and morbidity.    The differential diagnosis includes palpitations, anxiety, electrolyte abnormality, arrhythmia, pregnancy, STEMI, NSTEMI, anemia  Additional history obtained: Records reviewed Primary Care Documents  ED Course and Reassessment: Patient ambulated while on pulse ox and maintained a heart rate in the 80s.  Independent labs interpretation:  The following labs were independently interpreted:  EKG: Normal sinus rhythm hysterectomy BMP: No notable findings CBC: No notable findings Urine pregnancy: Negative Troponin: <2  Independent visualization of imaging: - I independently visualized the following imaging with scope of interpretation limited to determining acute life threatening conditions related to emergency care: Chest x-ray, which revealed no active cardiopulmonary disease.   Consultation: - Consulted or discussed management/test interpretation w/ external professional: None  Consideration for  admission or further workup: Considered for mission further workup however patients vital signs, physical exam, labs, and imaging were reassuring.  Patient will be given ambulatory referral to cardiology for further evaluation of palpitations and possible treatment.         Final Clinical Impression(s) / ED Diagnoses Final diagnoses:  Palpitations    Rx / DC Orders ED Discharge Orders          Ordered    Ambulatory referral to Cardiology       Comments: If you have not heard from the Cardiology office within the next 72 hours please call [phone].   10/09/23 1115              Dolphus Jenny, PA-C 10/09/23 1143    Sloan Leiter, DO 10/10/23 [phone]

## 2023-10-09 NOTE — Discharge Instructions (Addendum)
 Today you are seen for palpitations.  Please follow-up with cardiology for further evaluation and treatment.  Thank you for letting us treat you today. After reviewing your labs and imaging, I feel you are safe to go home. Please follow up with your PCP in the next several days and provide them with your records from this visit. Return to the Emergency Room if pain becomes severe or symptoms worsen.

## 2023-10-09 NOTE — ED Triage Notes (Signed)
 Pt c/o SHOB while in shower this AM, states she was also lightheaded, since resolved. Advises she "opened the shower to get some cool air which helped," but c/o intermittent tachycardia since. Pt VSS while in triage, NAD noted.

## 2023-10-09 NOTE — ED Notes (Signed)
 Pt ambulated 200 foot. HR tachy around 150's when initially ambulating. At rest, pt normal HR 70-80's. Asymptomatic, no SOB and no palpitations at this time.

## 2023-10-09 NOTE — ED Notes (Signed)
 Pt given discharge instructions. Opportunities given for questions. Pt verbalizes understanding. Educated to follow-up with cardiology.  Jillyn Hidden, RN

## 2023-11-11 DIAGNOSIS — Z419 Encounter for procedure for purposes other than remedying health state, unspecified: Secondary | ICD-10-CM | POA: Diagnosis not present

## 2023-12-07 ENCOUNTER — Encounter: Payer: Self-pay | Admitting: Family

## 2023-12-07 ENCOUNTER — Ambulatory Visit: Admitting: Family

## 2023-12-07 VITALS — BP 122/86 | HR 89 | Temp 98.9°F | Ht 66.0 in | Wt 331.4 lb

## 2023-12-07 DIAGNOSIS — Z6841 Body Mass Index (BMI) 40.0 and over, adult: Secondary | ICD-10-CM

## 2023-12-07 DIAGNOSIS — N912 Amenorrhea, unspecified: Secondary | ICD-10-CM | POA: Insufficient documentation

## 2023-12-07 DIAGNOSIS — N926 Irregular menstruation, unspecified: Secondary | ICD-10-CM

## 2023-12-07 DIAGNOSIS — E282 Polycystic ovarian syndrome: Secondary | ICD-10-CM

## 2023-12-07 DIAGNOSIS — L680 Hirsutism: Secondary | ICD-10-CM | POA: Diagnosis not present

## 2023-12-07 LAB — HEMOGLOBIN A1C: Hgb A1c MFr Bld: 5.5 % (ref 4.6–6.5)

## 2023-12-07 LAB — T3, FREE: T3, Free: 4.1 pg/mL (ref 2.3–4.2)

## 2023-12-07 LAB — T4, FREE: Free T4: 0.77 ng/dL (ref 0.60–1.60)

## 2023-12-07 LAB — TSH: TSH: 1.8 u[IU]/mL (ref 0.35–5.50)

## 2023-12-07 LAB — FOLLICLE STIMULATING HORMONE: FSH: 4.3 m[IU]/mL

## 2023-12-07 LAB — POCT URINE PREGNANCY: Preg Test, Ur: NEGATIVE

## 2023-12-07 LAB — TESTOSTERONE: Testosterone: 55.42 ng/dL — ABNORMAL HIGH (ref 15.00–40.00)

## 2023-12-07 NOTE — Progress Notes (Signed)
 Established Patient Office Visit  Subjective:   Patient ID: Susan Vazquez, female    DOB: Aug 10, 2000  Age: 23 y.o. MRN: [id number]  CC:  Chief Complaint  Patient presents with   Medical Management of Chronic Issues    States that she has not had a period since December 24.    HPI: Susan Vazquez is a 23 y.o. female presenting on 12/07/2023 for Medical Management of Chronic Issues (States that she has not had a period since December 24.)  Irregular periods, always have been irregular however has been even more so irregular and has not had a period since December/new years. She is not even spotting. This past week thought she was cramping and about to start her period but didn't. She also c/o heartburn 'out of nowhere' has been waking up with it which is unusual for her. Also with some mood swings. She has gained some weight, eating a bit better and has been walking a bit more as well. She was rx wegovy  but she has not started taking it yet.   She does state she tested with a urine pregnancy test at home with a faint pink line and then she repeated yesterday and was negative.   Wt Readings from Last 3 Encounters:  12/07/23 (!) 331 lb 6.4 oz (150.3 kg)  06/22/23 (!) 313 lb (142 kg)  03/30/23 (!) 303 lb (137.4 kg)           ROS: Negative unless specifically indicated above in HPI.   Relevant past medical history reviewed and updated as indicated.   Allergies and medications reviewed and updated.   Current Outpatient Medications:    fluticasone  (FLONASE ) 50 MCG/ACT nasal spray, Use 2 spray(s) in each nostril once daily, Disp: 48 g, Rfl: 0   levocetirizine (XYZAL ) 5 MG tablet, TAKE 1 TABLET BY MOUTH ONCE DAILY IN THE EVENING, Disp: 90 tablet, Rfl: 3   Semaglutide -Weight Management (WEGOVY ) 0.25 MG/0.5ML SOAJ, Start 0.25 mg qweek for four weeks, then increase to 0.5 mg dose qweek (Patient not taking: Reported on 12/07/2023), Disp: 2 mL, Rfl: 0   Semaglutide -Weight Management  (WEGOVY ) 0.5 MG/0.5ML SOAJ, Inject [place] 0.5 mg weekly (Patient not taking: Reported on 12/07/2023), Disp: 2 mL, Rfl: 2   Semaglutide -Weight Management (WEGOVY ) 1 MG/0.5ML SOAJ, Inject 1 mg into the skin once a week. (Patient not taking: Reported on 12/07/2023), Disp: 6 mL, Rfl: 1  Allergies  Allergen Reactions   Sulfa Antibiotics Rash    Objective:   BP 122/86 (BP Location: Left Arm, Patient Position: Sitting, Cuff Size: Large)   Pulse 89   Temp 98.9 F (37.2 C) (Temporal)   Ht 5\' 6"  (1.676 m)   Wt (!) 331 lb 6.4 oz (150.3 kg)   LMP 08/01/2023 (Exact Date)   SpO2 98%   BMI 53.49 kg/m    Physical Exam Vitals reviewed.  Constitutional:      General: She is not in acute distress.    Appearance: Normal appearance. She is normal weight. She is not ill-appearing, toxic-appearing or diaphoretic.  HENT:     Head: Normocephalic.  Cardiovascular:     Rate and Rhythm: Normal rate.  Pulmonary:     Effort: Pulmonary effort is normal.  Abdominal:     Tenderness: There is no abdominal tenderness.  Musculoskeletal:        General: Normal range of motion.  Neurological:     General: No focal deficit present.     Mental Status: She is alert  and oriented to person, place, and time. Mental status is at baseline.  Psychiatric:        Mood and Affect: Mood normal.        Behavior: Behavior normal.        Thought Content: Thought content normal.        Judgment: Judgment normal.     Assessment & Plan:  Amenorrhea Assessment & Plan: R/p pregnancy with hct qual pending results.  Workup for hormones, r/o thyroid  disease Ordering testosterone , fsh and thyroid  panel  If pregnancy negative, we can consider norethindrone to restart periods.      Orders: -     POCT urine pregnancy -     Thyroid  Peroxidase Antibodies (TPO) (REFL) -     T3, free -     T4, free -     TSH -     Follicle stimulating hormone -     Testosterone  -     hCG, serum, qualitative  Irregular periods/menstrual  cycles -     Thyroid  Peroxidase Antibodies (TPO) (REFL) -     T3, free -     T4, free -     TSH -     Follicle stimulating hormone -     Testosterone   Hirsutism -     Follicle stimulating hormone  PCOS (polycystic ovarian syndrome) Assessment & Plan: Orering A1c pending results.  Orders: -     Hemoglobin A1c -     Testosterone   Morbid obesity with BMI of 45.0-49.9, adult (HCC) Assessment & Plan: Pt advised to work on diet and exercise as tolerated   Orders: -     Hemoglobin A1c     Follow up plan: No follow-ups on file.  Felicita Horns, FNP

## 2023-12-07 NOTE — Assessment & Plan Note (Signed)
 Pt advised to work on diet and exercise as tolerated

## 2023-12-07 NOTE — Assessment & Plan Note (Signed)
 Orering A1c pending results.

## 2023-12-07 NOTE — Assessment & Plan Note (Signed)
 R/p pregnancy with hct qual pending results.  Workup for hormones, r/o thyroid  disease Ordering testosterone , fsh and thyroid  panel  If pregnancy negative, we can consider norethindrone to restart periods.

## 2023-12-08 ENCOUNTER — Encounter: Payer: Self-pay | Admitting: Family

## 2023-12-08 DIAGNOSIS — E282 Polycystic ovarian syndrome: Secondary | ICD-10-CM

## 2023-12-08 LAB — HCG, SERUM, QUALITATIVE: Preg, Serum: NEGATIVE

## 2023-12-08 LAB — THYROID PEROXIDASE ANTIBODIES (TPO) (REFL): Thyroperoxidase Ab SerPl-aCnc: 2 [IU]/mL (ref <9)

## 2023-12-08 MED ORDER — METFORMIN HCL 500 MG PO TABS
500.0000 mg | ORAL_TABLET | Freq: Two times a day (BID) | ORAL | 3 refills | Status: AC
Start: 2023-12-08 — End: ?

## 2023-12-11 DIAGNOSIS — Z419 Encounter for procedure for purposes other than remedying health state, unspecified: Secondary | ICD-10-CM | POA: Diagnosis not present

## 2023-12-23 ENCOUNTER — Encounter: Payer: Self-pay | Admitting: Family

## 2023-12-26 MED ORDER — FLUTICASONE PROPIONATE 50 MCG/ACT NA SUSP: 2.0000 | Freq: Every day | NASAL | 0 refills | Status: DC

## 2024-01-11 DIAGNOSIS — Z419 Encounter for procedure for purposes other than remedying health state, unspecified: Secondary | ICD-10-CM | POA: Diagnosis not present

## 2024-01-19 ENCOUNTER — Encounter (HOSPITAL_BASED_OUTPATIENT_CLINIC_OR_DEPARTMENT_OTHER): Payer: Self-pay | Admitting: Cardiology

## 2024-01-19 ENCOUNTER — Ambulatory Visit (HOSPITAL_BASED_OUTPATIENT_CLINIC_OR_DEPARTMENT_OTHER): Admitting: Cardiology

## 2024-01-19 ENCOUNTER — Ambulatory Visit: Attending: Cardiology

## 2024-01-19 VITALS — BP 110/60 | HR 114 | Ht 66.0 in | Wt 342.4 lb

## 2024-01-19 DIAGNOSIS — R55 Syncope and collapse: Secondary | ICD-10-CM

## 2024-01-19 DIAGNOSIS — R9431 Abnormal electrocardiogram [ECG] [EKG]: Secondary | ICD-10-CM | POA: Diagnosis not present

## 2024-01-19 NOTE — Patient Instructions (Signed)
 Medication Instructions:  The current medical regimen is effective;  continue present plan and medications.  *If you need a refill on your cardiac medications before your next appointment, please call your pharmacy*  Testing/Procedures: Your physician has requested that you have an echocardiogram. Echocardiography is a painless test that uses sound waves to create images of your heart. It provides your doctor with information about the size and shape of your heart and how well your heart's chambers and valves are working. This procedure takes approximately one hour. There are no restrictions for this procedure. Please do NOT wear cologne, perfume, aftershave, or lotions (deodorant is allowed). Please arrive 15 minutes prior to your appointment time.  Please note: We ask at that you not bring children with you during ultrasound (echo/ vascular) testing. Due to room size and safety concerns, children are not allowed in the ultrasound rooms during exams. Our front office staff cannot provide observation of children in our lobby area while testing is being conducted. An adult accompanying a patient to their appointment will only be allowed in the ultrasound room at the discretion of the ultrasound technician under special circumstances. We apologize for any inconvenience.  ZIO XT- Long Term Monitor Instructions  Your physician has requested you wear a ZIO patch monitor for 14 days.  This is a single patch monitor. [employer] supplies one patch monitor per enrollment. Additional stickers are not available. Please do not apply patch if you will be having a Nuclear Stress Test,  Echocardiogram, Cardiac CT, MRI, or Chest Xray during the period you would be wearing the  monitor. The patch cannot be worn during these tests. You cannot remove and re-apply the  ZIO XT patch monitor.  Your ZIO patch monitor will be mailed 3 day USPS to your address on file. It may take 3-5 days  to receive your monitor after  you have been enrolled.  Once you have received your monitor, please review the enclosed instructions. Your monitor  has already been registered assigning a specific monitor serial # to you.  Billing and Patient Assistance Program Information  We have supplied [employer] with any of your insurance information on file for billing purposes. [employer] offers a sliding scale Patient Assistance Program for patients that do not have  insurance, or whose insurance does not completely cover the cost of the ZIO monitor.  You must apply for the Patient Assistance Program to qualify for this discounted rate.  To apply, please call [employer] at [phone], select option 4, select option 2, ask to apply for  Patient Assistance Program. Sanna Crystal will ask your household income, and how many people  are in your household. They will quote your out-of-pocket cost based on that information.  [employer] will also be able to set up a 37-month, interest-free payment plan if needed.  Applying the monitor   Shave hair from upper left chest.  Hold abrader disc by orange tab. Rub abrader in 40 strokes over the upper left chest as  indicated in your monitor instructions.  Clean area with 4 enclosed alcohol pads. Let dry.  Apply patch as indicated in monitor instructions. Patch will be placed under collarbone on left  side of chest with arrow pointing upward.  Rub patch adhesive wings for 2 minutes. Remove white label marked 1. Remove the white  label marked 2. Rub patch adhesive wings for 2 additional minutes.  While looking in a mirror, press and release button in center of patch. A small green light will  flash  3-4 times. This will be your only indicator that the monitor has been turned on.  Do not shower for the first 24 hours. You may shower after the first 24 hours.  Press the button if you feel a symptom. You will hear a small click. Record Date, Time and  Symptom in the Patient Logbook.  When you are ready to  remove the patch, follow instructions on the last 2 pages of Patient  Logbook. Stick patch monitor onto the last page of Patient Logbook.  Place Patient Logbook in the blue and white box. Use locking tab on box and tape box closed  securely. The blue and white box has prepaid postage on it. Please place it in the mailbox as  soon as possible. Your physician should have your test results approximately 7 days after the  monitor has been mailed back to [facility].  Call [facility] Customer Care at [phone] if you have questions regarding  your ZIO XT patch monitor. Call them immediately if you see an orange light blinking on your  monitor.  If your monitor falls off in less than 4 days, contact our Monitor department at [phone].  If your monitor becomes loose or falls off after 4 days call [employer] at [phone] for  suggestions on securing your monitor   Follow-Up: At [facility], you and your health needs are our priority.  As part of our continuing mission to provide you with exceptional heart care, our providers are all part of one team.  This team includes your primary Cardiologist (physician) and Advanced Practice Providers or APPs (Physician Assistants and Nurse Practitioners) who all work together to provide you with the care you need, when you need it.  Your next appointment:   Follow up will be based on the results of the above testing.   We recommend signing up for the patient portal called MyChart.  Sign up information is provided on this After Visit Summary.  MyChart is used to connect with patients for Virtual Visits (Telemedicine).  Patients are able to view lab/test results, encounter notes, upcoming appointments, etc.  Non-urgent messages can be sent to your provider as well.   To learn more about what you can do with MyChart, go to [email or web address].

## 2024-01-19 NOTE — Progress Notes (Unsigned)
Enrolled for [employer] to mail a ZIO XT long term holter monitor to the patients address on file.   Dr. Skains to read. 

## 2024-01-19 NOTE — Progress Notes (Signed)
 Cardiology Office Note:  .   Date:  01/19/2024  ID:  Susan Vazquez, DOB November 26, 2000, MRN [id number] PCP: Felicita Horns, FNP  [facility] Providers Cardiologist:  None    History of Present Illness: .   Susan Vazquez is a 23 y.o. female Discussed the use of AI scribe software for clinical note transcription with the patient, who gave verbal consent to proceed.  History of Present Illness Susan Vazquez is a 23 year old female who presents with palpitations and intermittent tachycardia.  She has been experiencing palpitations and intermittent tachycardia since March 2025. During an episode in March, she felt lightheaded and short of breath after a shower, with a high heart rate recorded on her Apple Watch. At the emergency room, her heart rate spiked to 150 bpm on a mobile monitor. Since then, her heart rate occasionally reaches 117 bpm without a clear trigger, and anxiety can exacerbate the symptoms. She has not experienced the same intensity of symptoms as the initial episode but feels the need to sit and calm down when her heart rate is high.  Her past medical workup includes an EKG showing sinus rhythm, a chest x-ray, and lab results including hemoglobin A1c of 5.5, TSH of 1.8, LDL of 90, ALT of 22, sodium of 139, potassium of 3.8, and creatinine of 0.5. She reports no history of similar issues prior to this year.  In terms of lifestyle, she has reduced her intake of energy drinks, previously consuming Celsius drinks two to three times a week, and now only drinks coffee regularly. She denies smoking and reports occasional use of ashwagandha for stress and melatonin for sleep. She stays hydrated and uses a [employer] at work.  During the review of symptoms, she reports no significant impact from caffeine on her symptoms. She has not experienced any other significant symptoms such as chest pain or syncope since the initial episode.      Studies Reviewed: .         Results LABS HbA1c: 5.5 TSH: 1.8 LDL: 90 ALT: 22 Na: 139 K: 3.8 Cr: 0.5  RADIOLOGY Chest x-ray: Normal (09/2023)  DIAGNOSTIC EKG: Normal sinus rhythm, 82 bpm, normal intervals (10/09/2023) Risk Assessment/Calculations:            Physical Exam:   VS:  BP 110/60   Pulse (!) 114   Ht 5' 6 (1.676 m)   Wt (!) 342 lb 6.4 oz (155.3 kg)   SpO2 97%   BMI 55.26 kg/m    Wt Readings from Last 3 Encounters:  01/19/24 (!) 342 lb 6.4 oz (155.3 kg)  12/07/23 (!) 331 lb 6.4 oz (150.3 kg)  06/22/23 (!) 313 lb (142 kg)    GEN: Well nourished, well developed in no acute distress NECK: No JVD; No carotid bruits CARDIAC: RRR, no murmurs, no rubs, no gallops RESPIRATORY:  Clear to auscultation without rales, wheezing or rhonchi  ABDOMEN: Soft, non-tender, non-distended EXTREMITIES:  No edema; No deformity   ASSESSMENT AND PLAN: .    Assessment and Plan Assessment & Plan Tachycardia Intermittent episodes of elevated heart rate, reaching 150 bpm as per her Apple Watch. Symptoms include lightheadedness and dyspnea, particularly post-hot showers. EKG and prior chest X-ray are normal. Differential includes dysautonomia, specifically POTS, given symptoms and context. Conservative management is preferred due to risks of medication-induced hypotension. Symptoms may resolve over time without intervention. - Order Zio patch for two-week heart rhythm monitoring. - Order echocardiogram to assess cardiac structure  and function. - Advise on hydration and liberal salt intake to manage symptoms. - Recommend daily exercise, including recumbent or floor exercises. - Educate on potential triggers such as hot showers and advise caution.  Near syncope Near syncope episode in March associated with tachycardia and hot shower, likely due to vasodilation from heat exposure. - Advise caution with hot showers to prevent vasodilation and potential syncope.         Dispo: Will follow up with results of  testing  Signed, Dorothye Gathers, MD

## 2024-01-26 ENCOUNTER — Encounter (HOSPITAL_BASED_OUTPATIENT_CLINIC_OR_DEPARTMENT_OTHER): Payer: Self-pay | Admitting: Cardiology

## 2024-02-10 DIAGNOSIS — Z419 Encounter for procedure for purposes other than remedying health state, unspecified: Secondary | ICD-10-CM | POA: Diagnosis not present

## 2024-02-14 ENCOUNTER — Ambulatory Visit: Payer: Self-pay | Admitting: Cardiology

## 2024-02-14 DIAGNOSIS — R9431 Abnormal electrocardiogram [ECG] [EKG]: Secondary | ICD-10-CM | POA: Diagnosis not present

## 2024-02-14 DIAGNOSIS — R55 Syncope and collapse: Secondary | ICD-10-CM

## 2024-02-14 NOTE — Progress Notes (Signed)
 noted

## 2024-02-16 ENCOUNTER — Ambulatory Visit (INDEPENDENT_AMBULATORY_CARE_PROVIDER_SITE_OTHER)

## 2024-02-16 DIAGNOSIS — R9431 Abnormal electrocardiogram [ECG] [EKG]: Secondary | ICD-10-CM

## 2024-02-16 DIAGNOSIS — R55 Syncope and collapse: Secondary | ICD-10-CM

## 2024-02-16 LAB — ECHOCARDIOGRAM COMPLETE: Area-P 1/2: 3.48 cm2

## 2024-02-22 NOTE — Progress Notes (Signed)
 noted

## 2024-03-12 DIAGNOSIS — Z419 Encounter for procedure for purposes other than remedying health state, unspecified: Secondary | ICD-10-CM | POA: Diagnosis not present

## 2024-03-27 ENCOUNTER — Ambulatory Visit: Payer: Medicaid Other | Admitting: Dermatology

## 2024-04-09 ENCOUNTER — Encounter: Payer: Self-pay | Admitting: Dermatology

## 2024-04-09 ENCOUNTER — Other Ambulatory Visit: Payer: Self-pay | Admitting: Family

## 2024-04-09 ENCOUNTER — Ambulatory Visit: Admitting: Dermatology

## 2024-04-09 ENCOUNTER — Encounter: Payer: Self-pay | Admitting: Family

## 2024-04-09 DIAGNOSIS — D485 Neoplasm of uncertain behavior of skin: Secondary | ICD-10-CM | POA: Diagnosis not present

## 2024-04-09 DIAGNOSIS — D239 Other benign neoplasm of skin, unspecified: Secondary | ICD-10-CM

## 2024-04-09 DIAGNOSIS — Z86018 Personal history of other benign neoplasm: Secondary | ICD-10-CM | POA: Diagnosis not present

## 2024-04-09 DIAGNOSIS — Z808 Family history of malignant neoplasm of other organs or systems: Secondary | ICD-10-CM

## 2024-04-09 DIAGNOSIS — L821 Other seborrheic keratosis: Secondary | ICD-10-CM | POA: Diagnosis not present

## 2024-04-09 DIAGNOSIS — L814 Other melanin hyperpigmentation: Secondary | ICD-10-CM | POA: Diagnosis not present

## 2024-04-09 DIAGNOSIS — D492 Neoplasm of unspecified behavior of bone, soft tissue, and skin: Secondary | ICD-10-CM

## 2024-04-09 DIAGNOSIS — L578 Other skin changes due to chronic exposure to nonionizing radiation: Secondary | ICD-10-CM | POA: Diagnosis not present

## 2024-04-09 DIAGNOSIS — W908XXA Exposure to other nonionizing radiation, initial encounter: Secondary | ICD-10-CM

## 2024-04-09 DIAGNOSIS — D1801 Hemangioma of skin and subcutaneous tissue: Secondary | ICD-10-CM

## 2024-04-09 DIAGNOSIS — D225 Melanocytic nevi of trunk: Secondary | ICD-10-CM

## 2024-04-09 DIAGNOSIS — Z1283 Encounter for screening for malignant neoplasm of skin: Secondary | ICD-10-CM

## 2024-04-09 DIAGNOSIS — J301 Allergic rhinitis due to pollen: Secondary | ICD-10-CM

## 2024-04-09 DIAGNOSIS — D229 Melanocytic nevi, unspecified: Secondary | ICD-10-CM

## 2024-04-09 HISTORY — DX: Other benign neoplasm of skin, unspecified: D23.9

## 2024-04-09 NOTE — Progress Notes (Signed)
 Follow-Up Visit   Subjective  Susan Vazquez is a 23 y.o. female who presents for the following: Skin Cancer Screening and Full Body Skin Exam hx of Dysplastic Nevus  The patient presents for Total-Body Skin Exam (TBSE) for skin cancer screening and mole check. The patient has spots, moles and lesions to be evaluated, some may be new or changing and the patient may have concern these could be cancer.  The following portions of the chart were reviewed this encounter and updated as appropriate: medications, allergies, medical history  Review of Systems:  No other skin or systemic complaints except as noted in HPI or Assessment and Plan.  Objective  Well appearing patient in no apparent distress; mood and affect are within normal limits.  A full examination was performed including scalp, head, eyes, ears, nose, lips, neck, chest, axillae, abdomen, back, buttocks, bilateral upper extremities, bilateral lower extremities, hands, feet, fingers, toes, fingernails, and toenails. All findings within normal limits unless otherwise noted below.   Relevant physical exam findings are noted in the Assessment and Plan.  Superior sacral area 0.6cm brown pap   Assessment & Plan   SKIN CANCER SCREENING PERFORMED TODAY.  ACTINIC DAMAGE - Chronic condition, secondary to cumulative UV/sun exposure - diffuse scaly erythematous macules with underlying dyspigmentation - Recommend daily broad spectrum sunscreen SPF 30+ to sun-exposed areas, reapply every 2 hours as needed.  - Staying in the shade or wearing long sleeves, sun glasses (UVA+UVB protection) and wide brim hats (4-inch brim around the entire circumference of the hat) are also recommended for sun protection.  - Call for new or changing lesions.  LENTIGINES, SEBORRHEIC KERATOSES, HEMANGIOMAS - Benign normal skin lesions - Benign-appearing - Call for any changes  MELANOCYTIC NEVI - Tan-brown and/or pink-flesh-colored symmetric macules and  papules - Benign appearing on exam today - Observation - Call clinic for new or changing moles - Recommend daily use of broad spectrum spf 30+ sunscreen to sun-exposed areas.   HISTORY OF DYSPLASTIC NEVUS No evidence of recurrence today Recommend regular full body skin exams Recommend daily broad spectrum sunscreen SPF 30+ to sun-exposed areas, reapply every 2 hours as needed.  Call if any new or changing lesions are noted between office visits  - L ear mid to sup helix   FAMILY HISTORY OF SKIN CANCER What type(s):Melanoma Who affected: cousin   VASCULAR BIRTHMARK sacral area Exam: pink macule on sacral area  Treatment Plan: Benign. Observe   NEOPLASM OF SKIN Superior sacral area Epidermal / dermal shaving  Lesion diameter (cm):  0.6 Informed consent: discussed and consent obtained   Timeout: patient name, date of birth, surgical site, and procedure verified   Procedure prep:  Patient was prepped and draped in usual sterile fashion Prep type:  Isopropyl alcohol Anesthesia: the lesion was anesthetized in a standard fashion   Anesthetic:  1% lidocaine w/ epinephrine 1-100,000 buffered w/ 8.4% NaHCO3 Instrument used: flexible razor blade   Hemostasis achieved with: pressure, aluminum chloride and electrodesiccation   Outcome: patient tolerated procedure well   Post-procedure details: sterile dressing applied and wound care instructions given   Dressing type: bandage (mupirocin )    Specimen 1 - Surgical pathology Differential Diagnosis: Irritated Nevus r/o Atypia  Check Margins: yes 0.6cm brown pap No follow-ups on file.  I, Grayce Saunas, RMA, am acting as scribe for Alm Rhyme, MD .   Documentation: I have reviewed the above documentation for accuracy and completeness, and I agree with the above.  Alm Rhyme,  MD

## 2024-04-09 NOTE — Patient Instructions (Addendum)

## 2024-04-10 LAB — SURGICAL PATHOLOGY

## 2024-04-10 MED ORDER — LEVOCETIRIZINE DIHYDROCHLORIDE 5 MG PO TABS: 5.0000 mg | ORAL_TABLET | Freq: Every evening | ORAL | 3 refills | Status: AC

## 2024-04-11 ENCOUNTER — Ambulatory Visit: Payer: Self-pay | Admitting: Dermatology

## 2024-04-11 ENCOUNTER — Encounter: Payer: Self-pay | Admitting: Dermatology

## 2024-04-11 NOTE — Telephone Encounter (Signed)
 Left pt msg to call for bx result/sh

## 2024-04-11 NOTE — Telephone Encounter (Signed)
-----   Message from Alm Rhyme sent at 04/11/2024  9:26 AM EDT ----- FINAL DIAGNOSIS        1. Skin, superior sacral area :       DYSPLASTIC COMPOUND NEVUS WITH MODERATE ATYPIA, DEEP MARGIN INVOLVED   Moderate dysplastic Recheck next visit ----- Message ----- From: Interface, Lab In Three Zero One Sent: 04/10/2024   5:38 PM EDT To: Alm JAYSON Rhyme, MD

## 2024-04-11 NOTE — Telephone Encounter (Signed)
 Patient advised of BX results. aw

## 2024-04-12 DIAGNOSIS — Z419 Encounter for procedure for purposes other than remedying health state, unspecified: Secondary | ICD-10-CM | POA: Diagnosis not present

## 2024-06-13 ENCOUNTER — Other Ambulatory Visit (HOSPITAL_COMMUNITY): Payer: Self-pay

## 2024-06-17 ENCOUNTER — Other Ambulatory Visit (HOSPITAL_COMMUNITY): Payer: Self-pay

## 2024-06-18 ENCOUNTER — Other Ambulatory Visit (HOSPITAL_COMMUNITY): Payer: Self-pay

## 2024-06-18 ENCOUNTER — Telehealth: Payer: Self-pay

## 2024-06-18 NOTE — Telephone Encounter (Signed)
 Pharmacy Patient Advocate Encounter   Received notification from Onbase that prior authorization for Wegovy  0.25 is required/requested.   Insurance verification completed.   The patient is insured through [facility] MEDICAID.   Per test claim: Effective October 1st, Medicaid discontinued coverage of GLP1 medications for weight loss (such as Wegovy  and Zepbound), unless the patient has a documented history of a heart attack or stroke. Zepbound will continue to be covered only for patients with moderate to severe sleep apnea (AHI 15-30) and a BMI greater than 40. Because of this change, the prior authorization team will not be submitting new PA requests for GLP1 medications prescribed for weight loss, as patients will be unable to continue therapy under Medicaid coverage. [name], [name] - [facility] [employer] ([employer])

## 2024-06-20 ENCOUNTER — Ambulatory Visit
Admission: RE | Admit: 2024-06-20 | Discharge: 2024-06-20 | Disposition: A | Discharge status: Home / Self Care | Source: Ambulatory Visit | Attending: Nurse Practitioner | Admitting: Nurse Practitioner

## 2024-06-20 VITALS — BP 132/82 | HR 84 | Temp 98.2°F | Resp 18

## 2024-06-20 DIAGNOSIS — J329 Chronic sinusitis, unspecified: Secondary | ICD-10-CM | POA: Diagnosis not present

## 2024-06-20 DIAGNOSIS — B9689 Other specified bacterial agents as the cause of diseases classified elsewhere: Secondary | ICD-10-CM | POA: Diagnosis not present

## 2024-06-20 MED ORDER — CEFDINIR 300 MG PO CAPS
300.0000 mg | ORAL_CAPSULE | Freq: Two times a day (BID) | ORAL | 0 refills | Status: AC
Start: 2024-06-20 — End: 2024-06-27

## 2024-06-20 NOTE — ED Triage Notes (Signed)
 Pt present with c/o sinus infection. Pt states she has bilateral fullness and ear pain, sinus pressure and sore throat. States the symptoms have been going on for 2 weeks. States she has tried remedies at home, with no relief.

## 2024-06-20 NOTE — Discharge Instructions (Addendum)
 Start cefdinir  twice daily for 7 days.  Continue your nasal rinses and nasal sprays.  Lots of rest and fluids.  Please follow-up with your PCP if your symptoms do not improve.  Please go to the ER if you develop any worsening symptoms.  I hope you feel better soon!

## 2024-06-20 NOTE — ED Provider Notes (Signed)
 [facility]    CSN: [id number] Arrival date & time: 06/20/24  1807      History   Chief Complaint Chief Complaint  Patient presents with   Ear Fullness    Been trying to battle my allergies/sinus with at home remedies and OTC meds but I'm to a point now where nothing is touching it and I'm not improving - Entered by patient    HPI Susan Vazquez is a 23 y.o. female  presents for evaluation of URI symptoms for 2 weeks. Patient reports associated symptoms of sinus pressure/pain with purulent nasal discharge, ear pain/fullness, postnasal drip with sore throat. Denies N/V/D,, fevers, body aches, shortness of breath. Patient does note have a hx of asthma. Patient is not an active smoker.   Reports no known sick contacts.  Pt has taken sinus medication OTC for symptoms. Pt has no other concerns at this time.    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   Dysplastic nevus 04/09/2024   Superior sacral area, moderate atypia   Irregular periods 07/02/2020   Miscarriage    PCOS (polycystic ovarian syndrome)    Screening for diabetes mellitus 07/02/2020   Seasonal allergies    Weight gain 07/02/2020    Patient Active Problem List   Diagnosis Date Noted   Irregular periods/menstrual cycles 12/07/2023   Hirsutism 12/07/2023   Amenorrhea 12/07/2023   History of abnormal cervical Pap smear 03/30/2023   Seasonal allergic rhinitis due to pollen 11/04/2021   PCOS (polycystic ovarian syndrome) 09/24/2021   Anxiety 08/30/2021   Morbid obesity with BMI of 45.0-49.9, adult (HCC) 07/02/2020    Past Surgical History:  Procedure Laterality Date   ADENOIDECTOMY Bilateral 2007   TONSILLECTOMY Bilateral    2007   WISDOM TOOTH EXTRACTION      OB History     Gravida  2   Para   0   Term  0   Preterm  0   AB  1   Living  0      SAB  1   IAB  0   Ectopic  0   Multiple  0   Live Births  0            Home Medications    Prior to Admission medications   Medication Sig Start Date End Date Taking? Authorizing Provider  cefdinir  (OMNICEF ) 300 MG capsule Take 1 capsule (300 mg total) by mouth 2 (two) times daily for 7 days. 06/20/24 06/27/24 Yes Sheletha Bow, Jodi R, NP  fluticasone  (FLONASE ) 50 MCG/ACT nasal spray Use 2 spray(s) in each nostril once daily 04/10/24   Dugal, Tabitha, FNP  levocetirizine (XYZAL ) 5 MG tablet Take 1 tablet (5 mg total) by mouth every evening. 04/10/24   Dugal, Tabitha, FNP  metFORMIN  (GLUCOPHAGE ) 500 MG tablet Take 1 tablet (500 mg total) by mouth 2 (two) times daily with a meal. 12/08/23   Corwin Antu, FNP  Semaglutide -Weight Management (WEGOVY ) 0.25 MG/0.5ML SOAJ Start 0.25 mg qweek for four weeks, then increase to 0.5 mg dose qweek Patient not taking: Reported on 12/07/2023 06/22/23   Dugal, Tabitha, FNP  Semaglutide -Weight Management (WEGOVY ) 0.5 MG/0.5ML SOAJ Inject [place] 0.5 mg weekly Patient not taking: Reported on 12/07/2023 06/22/23   Dugal,  Tabitha, FNP  Semaglutide -Weight Management (WEGOVY ) 1 MG/0.5ML SOAJ Inject 1 mg into the skin once a week. Patient not taking: Reported on 12/07/2023 06/22/23   Dugal, Tabitha, FNP  cetirizine (ZYRTEC) 10 MG tablet Take 10 mg by mouth daily.    03/04/20  [provider]    Family History Family History  Problem Relation Age of Onset   Diabetes Mother    Melanoma Father        stage 3 melonoma cancer   Dementia Maternal Grandmother    Suicidality Maternal Grandfather     Social History Social History   Tobacco Use   Smoking status: Never   Smokeless tobacco: Never  Vaping Use   Vaping status: Former  Substance Use Topics   Alcohol use: Not Currently   Drug use: Never     Allergies   Sulfa antibiotics   Review of Systems Review of Systems  HENT:  Positive for  congestion, ear pain, postnasal drip, sinus pressure and sinus pain.      Physical Exam Triage Vital Signs ED Triage Vitals [06/20/24 1823]  Encounter Vitals Group     BP 132/82     Girls Systolic BP Percentile      Girls Diastolic BP Percentile      Boys Systolic BP Percentile      Boys Diastolic BP Percentile      Pulse Rate 84     Resp 18     Temp 98.2 F (36.8 C)     Temp src      SpO2 97 %     Weight      Height      Head Circumference      Peak Flow      Pain Score      Pain Loc      Pain Education      Exclude from Growth Chart    No data found.  Updated Vital Signs BP 132/82 (BP Location: Left Arm)   Pulse 84   Temp 98.2 F (36.8 C)   Resp 18   LMP 01/03/2024 (Exact Date)   SpO2 97%   Visual Acuity Right Eye Distance:   Left Eye Distance:   Bilateral Distance:    Right Eye Near:   Left Eye Near:    Bilateral Near:     Physical Exam Vitals and nursing note reviewed.  Constitutional:      General: She is not in acute distress.    Appearance: She is well-developed. She is not ill-appearing.  HENT:     Head: Normocephalic and atraumatic.     Right Ear: Tympanic membrane and ear canal normal.     Left Ear: Tympanic membrane and ear canal normal.     Nose: Congestion present.     Right Turbinates: Swollen and pale.     Left Turbinates: Swollen and pale.     Right Sinus: Maxillary sinus tenderness present. No frontal sinus tenderness.     Left Sinus: Maxillary sinus tenderness present. No frontal sinus tenderness.     Mouth/Throat:     Mouth: Mucous membranes are moist.     Pharynx: Oropharynx is clear. Uvula midline. No oropharyngeal exudate or posterior oropharyngeal erythema.     Tonsils: No tonsillar exudate or tonsillar abscesses.  Eyes:     Conjunctiva/sclera: Conjunctivae normal.     Pupils: Pupils are equal, round, and reactive to light.  Cardiovascular:     Rate and Rhythm: Normal rate and regular rhythm.  Heart sounds: Normal heart  sounds.  Pulmonary:     Effort: Pulmonary effort is normal.     Breath sounds: Normal breath sounds.  Musculoskeletal:     Cervical back: Normal range of motion and neck supple.  Lymphadenopathy:     Cervical: No cervical adenopathy.  Skin:    General: Skin is warm and dry.  Neurological:     General: No focal deficit present.     Mental Status: She is alert and oriented to person, place, and time.  Psychiatric:        Mood and Affect: Mood normal.        Behavior: Behavior normal.      UC Treatments / Results  Labs (all labs ordered are listed, but only abnormal results are displayed) Labs Reviewed - No data to display  EKG   Radiology No results found.  Procedures Procedures (including critical care time)  Medications Ordered in UC Medications - No data to display  Initial Impression / Assessment and Plan / UC Course  I have reviewed the triage vital signs and the nursing notes.  Pertinent labs & imaging results that were available during my care of the patient were reviewed by me and considered in my medical decision making (see chart for details).     Reviewed exam and symptoms with patient.  Patient reports she does not respond well to Augmentin  but states cefdinir  works well for her.  Will do 7 days of cefdinir  for her sinus infection.  She is to continue her nasal sprays/rinses and to follow-up with PCP if symptoms do not improve.  ER precautions reviewed. Final Clinical Impressions(s) / UC Diagnoses   Final diagnoses:  Bacterial sinusitis     Discharge Instructions      Start cefdinir  twice daily for 7 days.  Continue your nasal rinses and nasal sprays.  Lots of rest and fluids.  Please follow-up with your PCP if your symptoms do not improve.  Please go to the ER if you develop any worsening symptoms.  I hope you feel better soon!    ED Prescriptions     Medication Sig Dispense Auth. Provider   cefdinir  (OMNICEF ) 300 MG capsule Take 1 capsule (300  mg total) by mouth 2 (two) times daily for 7 days. 14 capsule Dorette Hartel, Jodi R, NP      PDMP not reviewed this encounter.   Loreda Myla SAUNDERS, NP 06/20/24 [phone]

## 2024-07-01 ENCOUNTER — Encounter: Payer: Self-pay | Admitting: Family

## 2024-07-02 MED ORDER — FLUTICASONE PROPIONATE 50 MCG/ACT NA SUSP: 2.0000 | Freq: Every day | NASAL | 1 refills | Status: AC

## 2024-07-15 ENCOUNTER — Ambulatory Visit
Admission: RE | Admit: 2024-07-15 | Discharge: 2024-07-15 | Disposition: A | Discharge status: Home / Self Care | Attending: Family Medicine | Admitting: Family Medicine

## 2024-07-15 VITALS — BP 123/81 | HR 96 | Temp 99.1°F | Resp 18 | Ht 67.0 in | Wt 325.0 lb

## 2024-07-15 DIAGNOSIS — B349 Viral infection, unspecified: Secondary | ICD-10-CM | POA: Diagnosis not present

## 2024-07-15 DIAGNOSIS — J029 Acute pharyngitis, unspecified: Secondary | ICD-10-CM

## 2024-07-15 DIAGNOSIS — J111 Influenza due to unidentified influenza virus with other respiratory manifestations: Secondary | ICD-10-CM

## 2024-07-15 LAB — POCT RAPID STREP A (OFFICE): Rapid Strep A Screen: NEGATIVE

## 2024-07-15 LAB — POCT INFLUENZA A/B
Influenza A, POC: NEGATIVE
Influenza B, POC: NEGATIVE

## 2024-07-15 LAB — POC SOFIA SARS ANTIGEN FIA: SARS Coronavirus 2 Ag: NEGATIVE

## 2024-07-15 MED ORDER — PREDNISONE 20 MG PO TABS: ORAL_TABLET | ORAL | 0 refills | Status: AC

## 2024-07-15 NOTE — ED Provider Notes (Signed)
 [facility]    CSN: [id number] Arrival date & time: 07/15/24  1858      History   Chief Complaint Chief Complaint  Patient presents with   Sore Throat    I have no energy, feel icky and have a sore throat - Entered by patient    HPI Susan Vazquez is a 23 y.o. female.   HPI Pleasant 23 year old female presents with sore throat and no energy since yesterday. PMH significant for severe/morbid obesity, PCOS, and anxiety.  The patient is accompanied by her husband this evening.  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   Dysplastic nevus 04/09/2024   Superior sacral area, moderate atypia   Irregular periods 07/02/2020   Miscarriage    PCOS (polycystic ovarian syndrome)    Screening for diabetes mellitus 07/02/2020   Seasonal allergies    Weight gain 07/02/2020    Patient Active Problem List   Diagnosis Date Noted   Irregular periods/menstrual cycles 12/07/2023   Hirsutism 12/07/2023   Amenorrhea 12/07/2023   History of abnormal cervical Pap smear 03/30/2023   Seasonal allergic rhinitis due to pollen 11/04/2021   PCOS (polycystic ovarian syndrome) 09/24/2021   Anxiety 08/30/2021   Morbid obesity with BMI of 45.0-49.9, adult (HCC) 07/02/2020    Past Surgical History:  Procedure Laterality Date   ADENOIDECTOMY Bilateral 2007   TONSILLECTOMY Bilateral    2007   WISDOM TOOTH EXTRACTION      OB History     Gravida  2   Para  0   Term  0   Preterm  0   AB  1   Living  0      SAB  1   IAB  0   Ectopic  0   Multiple  0   Live Births  0            Home Medications    Prior to Admission medications  Medication Sig Start Date End Date Taking? Authorizing Provider  fluticasone  (FLONASE ) 50 MCG/ACT nasal spray Place 2 sprays into both nostrils  daily. 07/02/24  Yes Dugal, Tabitha, FNP  levocetirizine (XYZAL ) 5 MG tablet Take 1 tablet (5 mg total) by mouth every evening. 04/10/24  Yes Dugal, Ginger, FNP  predniSONE  (DELTASONE ) 20 MG tablet Take 3 tabs PO daily x 5 days. 07/15/24  Yes Teddy Sharper, FNP  metFORMIN  (GLUCOPHAGE ) 500 MG tablet Take 1 tablet (500 mg total) by mouth 2 (two) times daily with a meal. 12/08/23   Corwin Ginger, FNP  cetirizine (ZYRTEC) 10 MG tablet Take 10 mg by mouth daily.    03/04/20  [provider]    Family History Family History  Problem Relation Age of Onset   Diabetes Mother    Melanoma Father        stage 3 melonoma cancer   Dementia Maternal Grandmother    Suicidality Maternal Grandfather     Social History Social History[1]   Allergies   Sulfa antibiotics   Review of Systems Review of Systems   Physical Exam Triage Vital Signs ED Triage Vitals [07/15/24 1909]  Encounter Vitals Group     BP      Girls Systolic BP Percentile      Girls Diastolic BP Percentile  Boys Systolic BP Percentile      Boys Diastolic BP Percentile      Pulse      Resp      Temp      Temp src      SpO2      Weight (!) 325 lb (147.4 kg)     Height 5' 7 (1.702 m)     Head Circumference      Peak Flow      Pain Score 4     Pain Loc      Pain Education      Exclude from Growth Chart    No data found.  Updated Vital Signs BP 123/81 (BP Location: Right Arm)   Pulse 96   Temp 99.1 F (37.3 C) (Oral)   Resp 18   Ht 5' 7 (1.702 m)   Wt (!) 325 lb (147.4 kg)   LMP 06/26/2024 (Exact Date)   SpO2 95%   BMI 50.90 kg/m   Visual Acuity Right Eye Distance:   Left Eye Distance:   Bilateral Distance:    Right Eye Near:   Left Eye Near:    Bilateral Near:     Physical Exam Vitals and nursing note reviewed.  Constitutional:      Appearance: Normal appearance. She is well-developed. She is obese. She is ill-appearing.  HENT:     Head: Normocephalic and atraumatic.     Right Ear:  Tympanic membrane, ear canal and external ear normal.     Left Ear: Tympanic membrane, ear canal and external ear normal.     Mouth/Throat:     Mouth: Mucous membranes are moist.     Pharynx: Oropharynx is clear. Uvula midline. Posterior oropharyngeal erythema present. No uvula swelling.     Tonsils: 0 on the right. 0 on the left.  Eyes:     Extraocular Movements: Extraocular movements intact.     Conjunctiva/sclera: Conjunctivae normal.     Pupils: Pupils are equal, round, and reactive to light.  Cardiovascular:     Rate and Rhythm: Normal rate and regular rhythm.     Heart sounds: No murmur heard. Pulmonary:     Effort: Pulmonary effort is normal.     Breath sounds: Normal breath sounds. No wheezing, rhonchi or rales.  Musculoskeletal:        General: Normal range of motion.  Skin:    General: Skin is warm.  Neurological:     General: No focal deficit present.     Mental Status: She is alert and oriented to person, place, and time. Mental status is at baseline.  Psychiatric:        Mood and Affect: Mood normal.        Behavior: Behavior normal.      UC Treatments / Results  Labs (all labs ordered are listed, but only abnormal results are displayed) Labs Reviewed  POCT INFLUENZA A/B - Normal  POC SOFIA SARS ANTIGEN FIA - Normal  POCT RAPID STREP A (OFFICE) - Normal    EKG   Radiology No results found.  Procedures Procedures (including critical care time)  Medications Ordered in UC Medications - No data to display  Initial Impression / Assessment and Plan / UC Course  I have reviewed the triage vital signs and the nursing notes.  Pertinent labs & imaging results that were available during my care of the patient were reviewed by me and considered in my medical decision making (see chart for details).  MDM: 1.  Influenza-like illness-COVID-19, influenza A/B both negative this evening; 2.  Viral illness-Advised patient all tests were negative today.  Advised  patient to take prednisone  daily for the next 5 days.  Advised may take OTC Tylenol  1000 mg every 6 hours for fever (oral temperature greater than 100.3).  Encouraged increase daily water intake to 64 ounces per day while taking these medications.  Advised if symptoms worsen and/or unresolved please follow-up with PCP or here for further evaluation.  3.  Sore throat-Rx prednisone  20 mg tablet: Take 3 tablets p.o. daily x 5 days.  Patient discharged home, hemodynamically stable. Final Clinical Impressions(s) / UC Diagnoses   Final diagnoses:  Influenza-like illness  Viral illness  Sore throat     Discharge Instructions      Advised patient all tests were negative today.  Advised patient to take prednisone  daily for the next 5 days.  Advised may take OTC Tylenol  1000 mg every 6 hours for fever (oral temperature greater than 100.3).  Encouraged increase daily water intake to 64 ounces per day while taking these medications.  Advised if symptoms worsen and/or unresolved please follow-up with PCP or here for further evaluation.     ED Prescriptions     Medication Sig Dispense Auth. Provider   predniSONE  (DELTASONE ) 20 MG tablet Take 3 tabs PO daily x 5 days. 15 tablet Shelena Castelluccio, FNP      PDMP not reviewed this encounter.    [1]  Social History Tobacco Use   Smoking status: Never   Smokeless tobacco: Never  Vaping Use   Vaping status: Former  Substance Use Topics   Alcohol use: Not Currently   Drug use: Never     Teddy Sharper, FNP 07/15/24 1958

## 2024-07-15 NOTE — ED Triage Notes (Signed)
 Patient c/o sore throat since yesterday, HA, bodyaches, sinus drainage.  No cough or ear pain.  Patient has taken OTC allergy meds.

## 2024-07-15 NOTE — Discharge Instructions (Addendum)
 Advised patient all tests were negative today.  Advised patient to take prednisone  daily for the next 5 days.  Advised may take OTC Tylenol  1000 mg every 6 hours for fever (oral temperature greater than 100.3).  Encouraged increase daily water intake to 64 ounces per day while taking these medications.  Advised if symptoms worsen and/or unresolved please follow-up with PCP or here for further evaluation.

## 2025-04-15 ENCOUNTER — Ambulatory Visit: Admitting: Dermatology
